# Patient Record
Sex: Female | Born: 1965 | Race: White | Hispanic: No | Marital: Married | Smoking: Never smoker
Health system: Southern US, Community
[De-identification: ages and names within clinical notes are randomized; demographics above are authoritative.]

## PROBLEM LIST (undated history)

## (undated) DIAGNOSIS — D352 Benign neoplasm of pituitary gland: Secondary | ICD-10-CM

## (undated) DIAGNOSIS — M1990 Unspecified osteoarthritis, unspecified site: Secondary | ICD-10-CM

## (undated) DIAGNOSIS — M6740 Ganglion, unspecified site: Secondary | ICD-10-CM

## (undated) DIAGNOSIS — H539 Unspecified visual disturbance: Secondary | ICD-10-CM

## (undated) HISTORY — PX: COLONOSCOPY: SHX174

## (undated) HISTORY — DX: Unspecified visual disturbance: H53.9

## (undated) HISTORY — PX: GANGLION CYST EXCISION: SHX1691

---

## 2006-11-15 ENCOUNTER — Ambulatory Visit (HOSPITAL_COMMUNITY): Admission: RE | Admit: 2006-11-15 | Discharge: 2006-11-15 | Payer: Self-pay | Admitting: Obstetrics and Gynecology

## 2006-11-15 ENCOUNTER — Encounter (INDEPENDENT_AMBULATORY_CARE_PROVIDER_SITE_OTHER): Payer: Self-pay | Admitting: *Deleted

## 2009-03-19 ENCOUNTER — Ambulatory Visit: Payer: Self-pay | Admitting: Sports Medicine

## 2009-03-19 DIAGNOSIS — M76829 Posterior tibial tendinitis, unspecified leg: Secondary | ICD-10-CM | POA: Insufficient documentation

## 2009-07-05 ENCOUNTER — Ambulatory Visit: Payer: Self-pay | Admitting: Sports Medicine

## 2009-07-05 DIAGNOSIS — M722 Plantar fascial fibromatosis: Secondary | ICD-10-CM | POA: Insufficient documentation

## 2009-08-23 ENCOUNTER — Ambulatory Visit: Payer: Self-pay

## 2011-03-03 NOTE — H&P (Signed)
NAME:  Barbara Estrada, HICKMON NO.:  [id number]   MEDICAL RECORD NO.:  [id number]          PATIENT TYPE:  AMB   LOCATION:  SDC                           FACILITY:  [facility]   PHYSICIAN:  Juluis Mire, M.D.   DATE OF BIRTH:  [birth year]   DATE OF ADMISSION:  DATE OF DISCHARGE:                              HISTORY & PHYSICAL   The patient is a 45 year old nulligravida female with a history of heavy  bleeding.  Her cycles are 5-7 days with heavy flow.  This has been  disruptive of activity.  Although her hemoglobin has remained stable, we  did a saline infusion ultrasound.  There were some small polyps,  otherwise unremarkable, findings highly suggestive of adenomyosis.  Her  husband has had a prior vasectomy.  In view of the findings, we are  going to proceed with hysteroscopic resection, and she has decided to  proceed with ablation.  We will proceed with ThermaChoice ablation after  resection of the polyps.   ALLERGIES:  No known drug allergies.   MEDICATIONS:  Dopamine.   PAST MEDICAL HISTORY:  Usual childhood disease without any significant  sequelae.  She has had no previous surgical or obstetrical history.   FAMILY HISTORY:  Mother, paternal grandmother with a history of breast  cancer.  Maternal grandfather with bladder cancer.  Father with history  of hypertension.   SOCIAL HISTORY:  No tobacco or alcohol use.   REVIEW OF SYSTEMS:  Noncontributory.   PHYSICAL EXAMINATION:  VITAL SIGNS:  Patient afebrile, stable vital  signs.  HEENT: The patient is normocephalic.  Pupils equal, round and reactive  to light and accommodation.  Extraocular movements were intact.  Sclerae  and conjunctivae clear.  Oropharynx clear.  NECK:  Without thyromegaly.  BREASTS:  No discrete masses.  LUNGS:  Clear.  CARDIAC:  Regular rhythm and rate without murmurs or gallops.  ABDOMEN:  Benign.  No mass, organomegaly or tenderness.  PELVIC:  Normal external genitalia.  Vaginal mucosa  is clear.  Cervix  unremarkable.  Uterus normal size, shape and contour.  Adnexa free of  masses or tenderness.  EXTREMITIES:  Trace edema.  NEUROLOGIC:  Grossly within normal limits.   IMPRESSION:  1. Menorrhagia.  2. Endometrial polyps.   PLAN:  At the present time the patient will undergo hysteroscopy.  We  will remove the polyps, then proceed with ThermaChoice ablation.  The  risks have been discussed including the risk of infection; the risk of  hemorrhage that could require transfusion with the risk of AIDS or  hepatitis; if bleeding would continue, the possible need for  hysterectomy; the risk of injury to adjacent organs including but not  limited to the bowel that could require further exploratory surgery; the  risk of deep venous thrombosis and pulmonary embolus.  She does  understand this precludes future pregnancies.  Again, her husband has  had a vasectomy.  Success rates of 80-90% are quoted, amenorrhea rates  of 40% discussed.      Juluis Mire, M.D.  Electronically Signed     JSM/MEDQ  D:  11/15/2006  T:  11/15/2006  Job:  [id number]

## 2011-03-03 NOTE — Op Note (Signed)
NAME:  Barbara Estrada, Barbara Estrada                 ACCOUNT NO.:  [id number]   MEDICAL RECORD NO.:  [id number]          PATIENT TYPE:  AMB   LOCATION:  SDC                           FACILITY:  [facility]   PHYSICIAN:  Juluis Mire, M.D.   DATE OF BIRTH:  08/29/1966   DATE OF PROCEDURE:  11/15/2006  DATE OF DISCHARGE:                               OPERATIVE REPORT   PREOPERATIVE DIAGNOSIS:  Menorrhagia.   POSTOPERATIVE DIAGNOSIS:  Menorrhagia.   PROCEDURE:  1. Hysteroscopy.  2. Endometrial curettings.  3. ThermaChoice ablation.  4. Paracervical block.   SURGEON:  Juluis Mire, M.D.   ANESTHESIA:  Paracervical block and general anesthesia.   ESTIMATED BLOOD LOSS:  Minimal.   PACKS AND DRAINS:  None.   INTRAOPERATIVE BLOOD REPLACEMENT:  None.   COMPLICATIONS:  None.   INDICATIONS:  In dictated history and physical.   PROCEDURE:  The patient was taken to the OR and placed in supine  position.  After satisfactory level of general anesthesia was obtained,  the patient was placed in dorsal lithotomy position using the Allen  stirrups.  Perineum and vagina were cleansed out with Betadine and  draped as a sterile field.  Speculum was placed in the vaginal vault and  cervix was grasped with a single-tooth tenaculum.  Paracervical block  was instituted using 1% Nesacaine.  Uterus sounded to 8 cm.  Cervix was  dilated to a size 21 Pratt dilator.  Hysteroscope was introduced and  intrauterine cavity was distended using D-5-W.  Visualization revealed  no endometrial polyps at the present time, just some thickened  endometrium.  We did endometrial curettings and this was sent for  pathology.  At this point in time, ThermaChoice was brought in.  It was  inserted.  The balloon was inflated to an adequate pressure.  After it  stabilized, the ablation cycle was done for 8 minutes.  After the cool-  down cycle, the balloon was deflated and removed intact.  We again did a  hysteroscopy on her.  She had  adequate ablation of the endometrium.  There were no signs of perforation or  complications.  At this point in time, the single-tooth tenaculum and  speculum were then removed.  The patient was taken out of the dorsal  lithotomy position and once alert and extubated, transferred to recovery  room in good condition.  Sponge, instrument and needle count was  reported as correct by circulating nurse x2.      Juluis Mire, M.D.  Electronically Signed     JSM/MEDQ  D:  11/15/2006  T:  11/15/2006  Job:  [id number]

## 2011-11-01 ENCOUNTER — Other Ambulatory Visit: Payer: Self-pay | Admitting: Internal Medicine

## 2011-11-01 DIAGNOSIS — Z1231 Encounter for screening mammogram for malignant neoplasm of breast: Secondary | ICD-10-CM

## 2011-11-22 ENCOUNTER — Ambulatory Visit
Admission: RE | Admit: 2011-11-22 | Discharge: 2011-11-22 | Disposition: A | Discharge status: Home / Self Care | Payer: 59 | Source: Ambulatory Visit | Attending: Internal Medicine | Admitting: Internal Medicine

## 2011-11-22 DIAGNOSIS — Z1231 Encounter for screening mammogram for malignant neoplasm of breast: Secondary | ICD-10-CM

## 2011-11-23 ENCOUNTER — Ambulatory Visit (INDEPENDENT_AMBULATORY_CARE_PROVIDER_SITE_OTHER): Payer: 59 | Admitting: Family Medicine

## 2011-11-23 VITALS — BP 128/76 | Ht 66.0 in | Wt 145.0 lb

## 2011-11-23 DIAGNOSIS — M7662 Achilles tendinitis, left leg: Secondary | ICD-10-CM

## 2011-11-23 DIAGNOSIS — M7660 Achilles tendinitis, unspecified leg: Secondary | ICD-10-CM

## 2011-11-23 DIAGNOSIS — M7630 Iliotibial band syndrome, unspecified leg: Secondary | ICD-10-CM

## 2011-11-23 DIAGNOSIS — M7661 Achilles tendinitis, right leg: Secondary | ICD-10-CM

## 2011-11-23 DIAGNOSIS — M629 Disorder of muscle, unspecified: Secondary | ICD-10-CM

## 2011-11-23 MED ORDER — NITROGLYCERIN 0.2 MG/HR TD PT24: MEDICATED_PATCH | TRANSDERMAL | Status: DC

## 2011-11-23 NOTE — Progress Notes (Signed)
Subjective:    Patient ID: Barbara Estrada, female    DOB: 1966-01-28, 46 y.o.   MRN: [id number]  HPI  Ms Quinby is a pleasant 46 years old and the patient went today complaining of bilateral Achilles tendinitis  for the last 8 months. She is a runner. She is running 22 miles a week. He is training for half pack April. She denies an injury to her heels. Today the pins were in the right side. The pain is located in the posterior heel at the insertion of the Achilles tendon the calcaneus, the right-sided pain is 410 intensity, and left-sided tumor in intensity. The pain is worse after running. Not as much during running. Mild swelling. No numbness or tingling. She has tried some stretches without no much success.  His also complaining of right knee lateral pain for the last month, she denies an injury to her knee. The pain is located on the lateral aspect of the knee, dull ache, 2/10 intensity, worse with running he improved by rest, the pain radiates to the lateral aspect of her right thigh.. No mechanical symptoms of her knee.  Review of Systems  Constitutional: Negative for fever, chills, diaphoresis and fatigue.  Musculoskeletal: Negative for back pain, joint swelling, arthralgias and gait problem.  Neurological: Negative for weakness and numbness.       Objective:   Physical Exam  Constitutional: She is oriented to person, place, and time. She appears well-developed and well-nourished.       BP 128/76  Ht 5\' 6"  (1.676 m)  Wt 145 lb (65.772 kg)  BMI 23.40 kg/m2   Pulmonary/Chest: Effort normal.  Musculoskeletal:       Right heel with tenderness to palpation in the insertion of the Achilles tendon the calcaneus. 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 The Achilles tendon is intact. Neurovascularly intact.  Left heel with tenderness to palpation in the insertion of the Achilles tendon the calcaneus. 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 The Achilles tendon is intact. Neurovascularly  intact. Feet with a good structure  Right knee with intact skin tear range of motion without pain. There is tenderness to palpation along the distal IT band. Ligaments are intact the. A pain on either joint line. Mild patellofemoral crepitus with patellofemoral compression test positive    Neurological: She is alert and oriented to person, place, and time.  Skin: Skin is warm. No rash noted. No erythema.  Psychiatric: She has a normal mood and affect. Her behavior is normal. Thought content normal.          Assessment & Plan:   1. Achilles tendinitis of right lower extremity  nitroGLYCERIN (NITRODUR - DOSED IN MG/24 HR) 0.2 mg/hr  2. Achilles tendinitis of left lower extremity  nitroGLYCERIN (NITRODUR - DOSED IN MG/24 HR) 0.2 mg/hr  3. IT band syndrome     Use knee sleeve Decrease running distance by a mile for the next 2 weeks Use knee sleeve in the right knee IT band stretches daily Achilles excentric exercises Ice in achilles tendons and it band 20 min twice  A day  Nitroglycerin Protocol   Apply 1/4 nitroglycerin patch to affected area daily.  Change position of patch within the affected area every 24 hours.  You may experience a headache during the first 1-2 weeks of using the patch, these should subside.  If you experience headaches after beginning nitroglycerin patch treatment, you may take your preferred over the counter pain reliever.  Another side effect of the  nitroglycerin patch is skin irritation or rash related to patch adhesive.  Please notify our office if you develop more severe headaches or rash, and stop the patch.  Tendon healing with nitroglycerin patch may require 12 to 24 weeks depending on the extent of injury.  Men should not use if taking Viagra, Cialis, or Levitra.   Do not use if you have migraines or rosacea.

## 2011-11-23 NOTE — Patient Instructions (Signed)
Use knee sleeve Decrease running distance by a mile for the next 2 weeks Use knee sleeve in the right knee IT band stretches daily Achilles excentric exercises Ice in achilles tendons and it band 20 min twice  A day  Nitroglycerin Protocol   Apply 1/4 nitroglycerin patch to affected area daily.  Change position of patch within the affected area every 24 hours.  You may experience a headache during the first 1-2 weeks of using the patch, these should subside.  If you experience headaches after beginning nitroglycerin patch treatment, you may take your preferred over the counter pain reliever.  Another side effect of the nitroglycerin patch is skin irritation or rash related to patch adhesive.  Please notify our office if you develop more severe headaches or rash, and stop the patch.  Tendon healing with nitroglycerin patch may require 12 to 24 weeks depending on the extent of injury.  Men should not use if taking Viagra, Cialis, or Levitra.   Do not use if you have migraines or rosacea.

## 2011-12-21 ENCOUNTER — Ambulatory Visit (INDEPENDENT_AMBULATORY_CARE_PROVIDER_SITE_OTHER): Payer: 59 | Admitting: Family Medicine

## 2011-12-21 ENCOUNTER — Encounter: Payer: Self-pay | Admitting: Family Medicine

## 2011-12-21 VITALS — BP 112/77 | HR 63

## 2011-12-21 DIAGNOSIS — M629 Disorder of muscle, unspecified: Secondary | ICD-10-CM

## 2011-12-21 DIAGNOSIS — M7661 Achilles tendinitis, right leg: Secondary | ICD-10-CM

## 2011-12-21 DIAGNOSIS — M7630 Iliotibial band syndrome, unspecified leg: Secondary | ICD-10-CM

## 2011-12-21 DIAGNOSIS — M7662 Achilles tendinitis, left leg: Secondary | ICD-10-CM | POA: Insufficient documentation

## 2011-12-21 DIAGNOSIS — M7660 Achilles tendinitis, unspecified leg: Secondary | ICD-10-CM

## 2011-12-21 NOTE — Progress Notes (Signed)
  Subjective:    Patient ID: Barbara Estrada, female    DOB: 1966-04-28, 46 y.o.   MRN: [id number]  HPI  Barbara Estrada is a pleasant 46 years old patient, today to followup on her bilateral Achilles tendinitis in right knee IT band syndrome. We saw her previously for weeks ago and started her on nitroglycerin patch protocol for both of her Achilles, outflow eccentric calf and Achilles stretches. Regarding her IT band we started her on IT band, she has protocol in place her on a compression knee sleeve to help her running.   She states that she has improved significantly, her left Achilles has improved 90%, right Achilles has improved 80%, left IT band has improved 95% . She denies any severe side effect with the nitroglycerin patches.  She is running about 10 miles a week present. Her goal is to participate in the half marathon in the next coming April.  Patient Active Problem List  Diagnoses  . TIBIALIS TENDINITIS  . PLANTAR FASCIITIS, RIGHT  . Achilles tendinitis on left  . IT band syndrome   Current Outpatient Prescriptions on File Prior to Visit  Medication Sig Dispense Refill  . nitroGLYCERIN (NITRODUR - DOSED IN MG/24 HR) 0.2 mg/hr Nitroglycerin Protocol  Apply 1/4 nitroglycerin patch to affected area daily. Change position of patch within the affected area every 24 hours.  30 patch  2   No Known Allergies      Review of Systems  Constitutional: Negative for fever, chills, diaphoresis and fatigue.  Cardiovascular: Negative for leg swelling.  Musculoskeletal: Negative for back pain, joint swelling, arthralgias and gait problem.  Neurological: Negative for tremors, seizures, weakness and numbness.       Objective:   Physical Exam  Constitutional: She is oriented to person, place, and time. She appears well-developed and well-nourished.       BP 112/77  Pulse 63   Pulmonary/Chest: Effort normal.  Musculoskeletal:       Right heel with mild tenderness to palpation in the  insertion of the Achilles tendon the calcaneus. The achilles nodule has decreased. The Achilles tendon is intact. Neurovascularly intact.  Left heel with tenderness to palpation in the insertion of the Achilles tendon the calcaneus. The achilles nodule has decreased. The Achilles tendon is intact. Neurovascularly intact. Feet with a good structure  Right knee with intact skin tear range of motion without pain.  No tenderness to palpation along the distal IT band. Ligaments are intact the. No pain on either joint line. Mild patellofemoral crepitus with patellofemoral compression test positive     Neurological: She is alert and oriented to person, place, and time.  Skin: Skin is warm. No erythema.  Psychiatric: She has a normal mood and affect. Her behavior is normal. Thought content normal.          Assessment & Plan:   1. Achilles tendinitis on right   2. Achilles tendinitis on left   3. IT band syndrome    Cont nitro patches. Cont achilles excentric exercises F/U in 4 weeks . We will repeat U/S of achilles in that visit Increase running distance gradually Cross train in between running days.

## 2012-01-18 ENCOUNTER — Ambulatory Visit (INDEPENDENT_AMBULATORY_CARE_PROVIDER_SITE_OTHER): Payer: 59 | Admitting: Family Medicine

## 2012-01-18 ENCOUNTER — Encounter: Payer: Self-pay | Admitting: Family Medicine

## 2012-01-18 VITALS — BP 104/75 | HR 87

## 2012-01-18 DIAGNOSIS — M7662 Achilles tendinitis, left leg: Secondary | ICD-10-CM

## 2012-01-18 DIAGNOSIS — M7630 Iliotibial band syndrome, unspecified leg: Secondary | ICD-10-CM

## 2012-01-18 DIAGNOSIS — M7660 Achilles tendinitis, unspecified leg: Secondary | ICD-10-CM

## 2012-01-18 DIAGNOSIS — M7661 Achilles tendinitis, right leg: Secondary | ICD-10-CM | POA: Insufficient documentation

## 2012-01-18 DIAGNOSIS — M629 Disorder of muscle, unspecified: Secondary | ICD-10-CM

## 2012-01-18 NOTE — Patient Instructions (Signed)
Increase nitro patch dose to a 1/2 of patch every 24 hours on the right achilles tendon Continue achilles strengthening exercises Continue heel lift while running Continue hips strengthening Do not run daily. F/U 2 month.

## 2012-01-18 NOTE — Progress Notes (Signed)
Subjective:    Patient ID: Barbara Estrada, female    DOB: 1965-12-01, 46 y.o.   MRN: [id number]  HPI  Ms.Marlar is a less than 45 years coming today to followup on her bilateral achilles tendinopathy, and also her right IT band syndrome. She has been using nitroglycerin patch in both of her Achilles tendons in February 2013, she has been doing the Achilles tendon eccentric exercises, she has been Ethelene Browns exercises for her hip abductors muscles, she has been doing present the IT band, she has been using a knee sleeve and right knee. Earlene Plater is doing much better. She denies any pain in her right IT band, she denies any pain in her left heel, she states that she is having a mild pain on her right Achilles tendon after running. He is tolerating well the nitroglycerin patch. She stopped the nitroglycerin patch on left heel 4 days ago. She is training for the [facility] marathon on 01/20/12. She is running average to 20 miles a week. She is doing well  Patient Active Problem List  Diagnoses  . TIBIALIS TENDINITIS  . PLANTAR FASCIITIS, RIGHT  . Achilles tendinitis on left  . IT band syndrome  . Achilles tendinitis on right   Current Outpatient Prescriptions on File Prior to Visit  Medication Sig Dispense Refill  . cabergoline (DOSTINEX) 0.5 MG tablet Take 0.5 mg by mouth 2 (two) times a week.      . nitroGLYCERIN (NITRODUR - DOSED IN MG/24 HR) 0.2 mg/hr Nitroglycerin Protocol  Apply 1/4 nitroglycerin patch to affected area daily. Change position of patch within the affected area every 24 hours.  30 patch  2   No Known Allergies   Review of Systems  Constitutional: Negative for fever, chills, diaphoresis and fatigue.  Musculoskeletal: Negative for back pain, joint swelling, arthralgias and gait problem.  Neurological: Positive for numbness. Negative for weakness.       Objective:   Physical Exam  Constitutional: She is oriented to person, place, and time. She appears well-developed and  well-nourished.       BP 104/75  Pulse 87   Pulmonary/Chest: Effort normal.  Musculoskeletal:       Right heel with no tenderness to palpation in the insertion of the Achilles tendon the calcaneus. The achilles nodule has decreased. The Achilles tendon is intact. Neurovascularly intact.  Left heel with no tenderness to palpation in the insertion of the Achilles tendon the calcaneus. The achilles nodule has decreased. The Achilles tendon is intact. Neurovascularly intact. Feet with a good structure  Right knee with intact skin tear range of motion without pain.  No tenderness to palpation along the distal IT band. Ligaments are intact the. No pain on either joint line.  Hip abductors strength 5/5   Neurological: She is alert and oriented to person, place, and time.  Skin: Skin is warm. No rash noted. No erythema. No pallor.    MSK U/S :  R achilles tendon with calcification in the insertion of the achilles in the calcaneous.No tears. L achilles tendon with calcification in the insertion of the achilles in the calcaneous.No tears.        Assessment & Plan:   1. Achilles tendinitis on left   2. IT band syndrome   3. Achilles tendinitis on right    Increase nitro patch dose to a 1/2 of patch every 24 hours on the right achilles tendon Continue achilles strengthening exercises Continue heel lift while running Continue hips strengthening Do not run  daily. F/U 2 month.

## 2012-11-01 ENCOUNTER — Other Ambulatory Visit: Payer: Self-pay | Admitting: Internal Medicine

## 2012-11-01 DIAGNOSIS — Z1231 Encounter for screening mammogram for malignant neoplasm of breast: Secondary | ICD-10-CM

## 2012-11-29 ENCOUNTER — Ambulatory Visit: Payer: 59

## 2012-12-13 ENCOUNTER — Ambulatory Visit
Admission: RE | Admit: 2012-12-13 | Discharge: 2012-12-13 | Disposition: A | Discharge status: Home / Self Care | Payer: 59 | Source: Ambulatory Visit | Attending: Internal Medicine | Admitting: Internal Medicine

## 2012-12-13 DIAGNOSIS — Z1231 Encounter for screening mammogram for malignant neoplasm of breast: Secondary | ICD-10-CM

## 2013-05-20 ENCOUNTER — Ambulatory Visit: Payer: 59 | Admitting: Family Medicine

## 2013-05-20 ENCOUNTER — Ambulatory Visit (INDEPENDENT_AMBULATORY_CARE_PROVIDER_SITE_OTHER): Payer: 59 | Admitting: Family Medicine

## 2013-05-20 ENCOUNTER — Encounter: Payer: Self-pay | Admitting: Family Medicine

## 2013-05-20 VITALS — BP 129/86 | HR 65 | Ht 67.0 in | Wt 150.0 lb

## 2013-05-20 DIAGNOSIS — M25569 Pain in unspecified knee: Secondary | ICD-10-CM

## 2013-05-20 DIAGNOSIS — M79609 Pain in unspecified limb: Secondary | ICD-10-CM

## 2013-05-20 DIAGNOSIS — M79605 Pain in left leg: Secondary | ICD-10-CM

## 2013-05-20 DIAGNOSIS — M25562 Pain in left knee: Secondary | ICD-10-CM

## 2013-05-20 NOTE — Patient Instructions (Addendum)
Start physical therapy and transition to a home exercise program - do this every day. Heat as needed 15 minutes at a time for spasms. Tylenol and/or aleve as needed for pain. Calf sleeve for compression when exercising (and any time it's painful, spasming on you). I'd avoid running for 2 weeks then restart a walk:jog 1:1 for program and increase total run time by 5 minutes, jog time by 1 minute each day (so 2:1 WUJ:WJXB for 15 minutes, 3:1 for 20 minutes etc). Cross train in meantime. Continue using your heel lifts. Follow up with me in 5-6 weeks for reevaluation.

## 2013-05-22 ENCOUNTER — Encounter: Payer: Self-pay | Admitting: Family Medicine

## 2013-05-22 DIAGNOSIS — M79605 Pain in left leg: Secondary | ICD-10-CM | POA: Insufficient documentation

## 2013-05-22 NOTE — Progress Notes (Signed)
Patient ID: Barbara Estrada, female   DOB: 25-Sep-1966, 47 y.o.   MRN: [id number]  PCP: Otila Back  Subjective:   HPI: Patient is a 47 y.o. female here for left calf pain.  Patient reports a week ago Friday she had some cramping in lateral left calf during a road run. Stretched this, took a few days off and felt better. Then on Saturday ran on a treadmill and felt bad cramping in same area - anterolateral lower leg. Stretched, walked, took a bodypump class. Has not run since this. Was up to 10-15 miles per week. Tried icing, biofreeze. Going to do a 1/2 marathon in November.  No pop, swelling, bruising.  History reviewed. No pertinent past medical history.  Current Outpatient Prescriptions on File Prior to Visit  Medication Sig Dispense Refill  . cabergoline (DOSTINEX) 0.5 MG tablet Take 0.5 mg by mouth 2 (two) times a week.      . nitroGLYCERIN (NITRODUR - DOSED IN MG/24 HR) 0.2 mg/hr Nitroglycerin Protocol  Apply 1/4 nitroglycerin patch to affected area daily. Change position of patch within the affected area every 24 hours.  30 patch  2   No current facility-administered medications on file prior to visit.    History reviewed. No pertinent past surgical history.  No Known Allergies  History   Social History  . Marital Status: Married    Spouse Name: N/A    Number of Children: N/A  . Years of Education: N/A   Occupational History  . Not on file.   Social History Main Topics  . Smoking status: Never Smoker   . Smokeless tobacco: Never Used  . Alcohol Use: Not on file  . Drug Use: Not on file  . Sexually Active: Not on file   Other Topics Concern  . Not on file   Social History Narrative  . No narrative on file    Family History  Problem Relation Age of Onset  . Hypertension Father   . Hyperlipidemia Neg Hx   . Diabetes Neg Hx   . Heart attack Neg Hx   . Sudden death Neg Hx     BP 129/86  Pulse 65  Ht 5\' 7"  (1.702 m)  Wt 150 lb (68.04 kg)   BMI 23.49 kg/m2  Review of Systems: See HPI above.    Objective:  Physical Exam:  Gen: NAD  L lower leg/ankle: No gross deformity, swelling, ecchymoses FROM ankle without pain. Strength 5/5 all directions with ankle, knee. TTP mildly lateral to tibia midportion of anterior musculature. Negative ant drawer and talar tilt.   Negative syndesmotic compression. Thompsons test negative. Negative hop test, fulcrum. NV intact distally.  MSK u/s: no abnormalities noted of fibula.    Assessment & Plan:  1. Left leg pain - consistent with lower leg extensor strain/spasms.  Very benign exam today.  No evidence stress fracture.  Start with PT, HEP, calf sleeve for compression.  Tylenol/aleve as needed.  Rest for 2 weeks but cross train.  Then restart walk:jog program.

## 2013-05-22 NOTE — Assessment & Plan Note (Signed)
consistent with lower leg extensor strain/spasms.  Very benign exam today.  No evidence stress fracture.  Start with PT, HEP, calf sleeve for compression.  Tylenol/aleve as needed.  Rest for 2 weeks but cross train.  Then restart walk:jog program.

## 2013-05-28 ENCOUNTER — Ambulatory Visit: Payer: 59 | Attending: Family Medicine

## 2013-05-28 DIAGNOSIS — IMO0001 Reserved for inherently not codable concepts without codable children: Secondary | ICD-10-CM | POA: Insufficient documentation

## 2013-05-28 DIAGNOSIS — M25569 Pain in unspecified knee: Secondary | ICD-10-CM | POA: Insufficient documentation

## 2013-06-05 ENCOUNTER — Ambulatory Visit: Payer: 59 | Admitting: Rehabilitation

## 2013-06-13 ENCOUNTER — Ambulatory Visit: Payer: 59

## 2013-06-27 ENCOUNTER — Encounter: Payer: Self-pay | Admitting: Family Medicine

## 2013-06-27 ENCOUNTER — Ambulatory Visit (INDEPENDENT_AMBULATORY_CARE_PROVIDER_SITE_OTHER): Payer: 59 | Admitting: Family Medicine

## 2013-06-27 VITALS — BP 113/75 | HR 59 | Ht 67.0 in | Wt 145.0 lb

## 2013-06-27 DIAGNOSIS — M79609 Pain in unspecified limb: Secondary | ICD-10-CM

## 2013-06-27 DIAGNOSIS — M79605 Pain in left leg: Secondary | ICD-10-CM

## 2013-06-27 NOTE — Progress Notes (Signed)
Patient ID: Barbara Estrada, female   DOB: November 27, 1965, 47 y.o.   MRN: [id number]  PCP: Otila Back  Subjective:   HPI: Patient is a 47 y.o. female here for left calf pain.  8/5: Patient reports a week ago Friday she had some cramping in lateral left calf during a road run. Stretched this, took a few days off and felt better. Then on Saturday ran on a treadmill and felt bad cramping in same area - anterolateral lower leg. Stretched, walked, took a bodypump class. Has not run since this. Was up to 10-15 miles per week. Tried icing, biofreeze. Going to do a 1/2 marathon in November.  No pop, swelling, bruising.  9/12: Patient reports no longer with pain. Used heel lifts, sleeve first week which helped. Did 3 visits of PT and now still doing HEP. No swelling, other complaints. Back on her running program with plans to do half marathon in November.  History reviewed. No pertinent past medical history.  Current Outpatient Prescriptions on File Prior to Visit  Medication Sig Dispense Refill  . cabergoline (DOSTINEX) 0.5 MG tablet Take 0.5 mg by mouth 2 (two) times a week.      . nitroGLYCERIN (NITRODUR - DOSED IN MG/24 HR) 0.2 mg/hr Nitroglycerin Protocol  Apply 1/4 nitroglycerin patch to affected area daily. Change position of patch within the affected area every 24 hours.  30 patch  2   No current facility-administered medications on file prior to visit.    History reviewed. No pertinent past surgical history.  No Known Allergies  History   Social History  . Marital Status: Married    Spouse Name: N/A    Number of Children: N/A  . Years of Education: N/A   Occupational History  . Not on file.   Social History Main Topics  . Smoking status: Never Smoker   . Smokeless tobacco: Never Used  . Alcohol Use: Not on file  . Drug Use: Not on file  . Sexual Activity: Not on file   Other Topics Concern  . Not on file   Social History Narrative  . No narrative on  file    Family History  Problem Relation Age of Onset  . Hypertension Father   . Hyperlipidemia Neg Hx   . Diabetes Neg Hx   . Heart attack Neg Hx   . Sudden death Neg Hx     BP 113/75  Pulse 59  Ht 5\' 7"  (1.702 m)  Wt 145 lb (65.772 kg)  BMI 22.71 kg/m2  Review of Systems: See HPI above.    Objective:  Physical Exam:  Gen: NAD  L lower leg/ankle: No gross deformity, swelling, ecchymoses FROM ankle without pain. Strength 5/5 all directions with ankle, knee. No TTP lateral to tibia midportion of anterior musculature. Negative ant drawer and talar tilt.   Negative syndesmotic compression. Thompsons test negative. Negative hop test, fulcrum. NV intact distally.  Assessment & Plan:  1. Left leg pain - consistent with lower leg extensor strain/spasms.  Resolved.  Continue HEP for another 4 weeks (3-4 days a week).  Follow up as needed.

## 2013-06-27 NOTE — Patient Instructions (Addendum)
Follow up as needed

## 2013-06-27 NOTE — Assessment & Plan Note (Signed)
consistent with lower leg extensor strain/spasms.  Resolved.  Continue HEP for another 4 weeks (3-4 days a week).  Follow up as needed.

## 2013-11-20 ENCOUNTER — Other Ambulatory Visit: Payer: Self-pay

## 2013-11-20 DIAGNOSIS — Z1231 Encounter for screening mammogram for malignant neoplasm of breast: Secondary | ICD-10-CM

## 2013-12-19 ENCOUNTER — Ambulatory Visit
Admission: RE | Admit: 2013-12-19 | Discharge: 2013-12-19 | Disposition: A | Discharge status: Home / Self Care | Payer: 59 | Source: Ambulatory Visit

## 2013-12-19 DIAGNOSIS — Z1231 Encounter for screening mammogram for malignant neoplasm of breast: Secondary | ICD-10-CM

## 2014-03-19 ENCOUNTER — Emergency Department (INDEPENDENT_AMBULATORY_CARE_PROVIDER_SITE_OTHER): Payer: 59

## 2014-03-19 ENCOUNTER — Encounter (HOSPITAL_COMMUNITY): Payer: Self-pay | Admitting: Emergency Medicine

## 2014-03-19 ENCOUNTER — Emergency Department (HOSPITAL_COMMUNITY)
Admission: EM | Admit: 2014-03-19 | Discharge: 2014-03-19 | Disposition: A | Discharge status: Home / Self Care | Payer: 59 | Source: Home / Self Care | Attending: Family Medicine | Admitting: Family Medicine

## 2014-03-19 DIAGNOSIS — J40 Bronchitis, not specified as acute or chronic: Secondary | ICD-10-CM

## 2014-03-19 MED ORDER — IPRATROPIUM BROMIDE 0.06 % NA SOLN: 2.0000 | Freq: Four times a day (QID) | NASAL | Status: DC

## 2014-03-19 MED ORDER — GUAIFENESIN-CODEINE 100-10 MG/5ML PO SOLN: 5.0000 mL | Freq: Every evening | ORAL | Status: DC | PRN

## 2014-03-19 MED ORDER — ALBUTEROL SULFATE HFA 108 (90 BASE) MCG/ACT IN AERS: 2.0000 | INHALATION_SPRAY | Freq: Four times a day (QID) | RESPIRATORY_TRACT | Status: DC | PRN

## 2014-03-19 MED ORDER — PREDNISONE 10 MG PO TABS: 30.0000 mg | ORAL_TABLET | Freq: Every day | ORAL | Status: DC

## 2014-03-19 MED ORDER — IPRATROPIUM-ALBUTEROL 0.5-2.5 (3) MG/3ML IN SOLN
RESPIRATORY_TRACT | Status: AC
  Filled 2014-03-19: qty 3

## 2014-03-19 MED ORDER — IPRATROPIUM-ALBUTEROL 0.5-2.5 (3) MG/3ML IN SOLN
3.0000 mL | Freq: Once | RESPIRATORY_TRACT | Status: AC
  Administered 2014-03-19: 3 mL via RESPIRATORY_TRACT

## 2014-03-19 NOTE — ED Provider Notes (Signed)
Barbara Estrada is a 48 y.o. female who presents to Urgent Care today for cough. Patient has had a cough now for 7-10 days. The cough is worse at night and in the morning. She also notes a sore throat and a runny nose. She has tried Mucinex and Tylenol which helped a bit. She denies any shortness of breath or wheezing. No fevers chills nausea vomiting or diarrhea. She feels well otherwise. Symptoms are moderate.   History reviewed. No pertinent past medical history. History  Substance Use Topics  . Smoking status: Never Smoker   . Smokeless tobacco: Never Used  . Alcohol Use: Yes   ROS as above Medications: No current facility-administered medications for this encounter.   Current Outpatient Prescriptions  Medication Sig Dispense Refill  . albuterol (PROVENTIL HFA;VENTOLIN HFA) 108 (90 BASE) MCG/ACT inhaler Inhale 2 puffs into the lungs every 6 (six) hours as needed for wheezing or shortness of breath.  1 Inhaler  2  . cabergoline (DOSTINEX) 0.5 MG tablet Take 0.5 mg by mouth 2 (two) times a week.      Marland Kitchen guaiFENesin-codeine 100-10 MG/5ML syrup Take 5 mLs by mouth at bedtime as needed for cough.  120 mL  0  . ipratropium (ATROVENT) 0.06 % nasal spray Place 2 sprays into both nostrils 4 (four) times daily.  15 mL  1  . nitroGLYCERIN (NITRODUR - DOSED IN MG/24 HR) 0.2 mg/hr Nitroglycerin Protocol  Apply 1/4 nitroglycerin patch to affected area daily. Change position of patch within the affected area every 24 hours.  30 patch  2  . predniSONE (DELTASONE) 10 MG tablet Take 3 tablets (30 mg total) by mouth daily.  15 tablet  0    Exam:  BP 134/64  Pulse 63  Temp(Src) 98.4 F (36.9 C) (Oral)  Resp 16  SpO2 96%  LMP 03/16/2014 Gen: Well NAD HEENT: EOMI,  MMM posterior pharynx with cobblestoning. Tympanic membranes are normal appearing bilaterally. Lungs: Normal work of breathing. Coarse breath sounds bilaterally Heart: RRR no MRG Abd: NABS, Soft. NT, ND Exts: Brisk capillary refill,  warm and well perfused.   Patient received a DuoNeb nebulizer treatment and had considerable improvement in symptoms and normalization of lung exam.  No results found for this or any previous visit (from the past 24 hour(s)). Dg Chest 2 View  03/19/2014   CLINICAL DATA:  Cough and fever and chest congestion.  EXAM: CHEST  2 VIEW  COMPARISON:  None.  FINDINGS: The lungs are well-expanded and clear. The heart and mediastinal structures are normal in appearance. There is no pleural effusion or pneumothorax. There is levoscoliosis of the thoracolumbar spine.  IMPRESSION: There is no active cardiopulmonary disease.   Electronically Signed   By: David  Martinique   On: 03/19/2014 09:45    Assessment and Plan: 48 y.o. female with bronchitis. Plan to treat with prednisone, albuterol, codeine containing cough medication, and Atrovent nasal spray.  Discussed warning signs or symptoms. Please see discharge instructions. Patient expresses understanding.    Gregor Hams, MD 03/19/14 1019

## 2014-03-19 NOTE — ED Notes (Signed)
C/o  Productive cough with green sputum.   Cough is worse at night.   Denies fever, n/v/d.  Symptom present x 10 days.   Mild relief with otc meds.

## 2014-03-19 NOTE — Discharge Instructions (Signed)
Thank you for coming in today. °Call or go to the emergency room if you get worse, have trouble breathing, have chest pains, or palpitations.  ° °Bronchitis °Bronchitis is inflammation of the airways that extend from the windpipe into the lungs (bronchi). The inflammation often causes mucus to develop, which leads to a cough. If the inflammation becomes severe, it may cause shortness of breath. °CAUSES  °Bronchitis may be caused by:  °· Viral infections.   °· Bacteria.   °· Cigarette smoke.   °· Allergens, pollutants, and other irritants.   °SIGNS AND SYMPTOMS  °The most common symptom of bronchitis is a frequent cough that produces mucus. Other symptoms include: °· Fever.   °· Body aches.   °· Chest congestion.   °· Chills.   °· Shortness of breath.   °· Sore throat.   °DIAGNOSIS  °Bronchitis is usually diagnosed through a medical history and physical exam. Tests, such as chest X-rays, are sometimes done to rule out other conditions.  °TREATMENT  °You may need to avoid contact with whatever caused the problem (smoking, for example). Medicines are sometimes needed. These may include: °· Antibiotics. These may be prescribed if the condition is caused by bacteria. °· Cough suppressants. These may be prescribed for relief of cough symptoms.   °· Inhaled medicines. These may be prescribed to help open your airways and make it easier for you to breathe.   °· Steroid medicines. These may be prescribed for those with recurrent (chronic) bronchitis. °HOME CARE INSTRUCTIONS °· Get plenty of rest.   °· Drink enough fluids to keep your urine clear or pale yellow (unless you have a medical condition that requires fluid restriction). Increasing fluids may help thin your secretions and will prevent dehydration.   °· Only take over-the-counter or prescription medicines as directed by your health care provider. °· Only take antibiotics as directed. Make sure you finish them even if you start to feel better. °· Avoid secondhand  smoke, irritating chemicals, and strong fumes. These will make bronchitis worse. If you are a smoker, quit smoking. Consider using nicotine gum or skin patches to help control withdrawal symptoms. Quitting smoking will help your lungs heal faster.   °· Put a cool-mist humidifier in your bedroom at night to moisten the air. This may help loosen mucus. Change the water in the humidifier daily. You can also run the hot water in your shower and sit in the bathroom with the door closed for 5 10 minutes.   °· Follow up with your health care provider as directed.   °· Wash your hands frequently to avoid catching bronchitis again or spreading an infection to others.   °SEEK MEDICAL CARE IF: °Your symptoms do not improve after 1 week of treatment.  °SEEK IMMEDIATE MEDICAL CARE IF: °· Your fever increases. °· You have chills.   °· You have chest pain.   °· You have worsening shortness of breath.   °· You have bloody sputum. °· You faint.   °· You have lightheadedness. °· You have a severe headache.   °· You vomit repeatedly. °MAKE SURE YOU:  °· Understand these instructions. °· Will watch your condition. °· Will get help right away if you are not doing well or get worse. °Document Released: 10/02/2005 Document Revised: 07/23/2013 Document Reviewed: 05/27/2013 °[employer]® Patient Information ©2014 [employer]. ° °

## 2014-03-23 ENCOUNTER — Emergency Department (HOSPITAL_COMMUNITY)
Admission: EM | Admit: 2014-03-23 | Discharge: 2014-03-23 | Disposition: A | Discharge status: Home / Self Care | Payer: 59 | Source: Home / Self Care | Attending: Family Medicine | Admitting: Family Medicine

## 2014-03-23 ENCOUNTER — Encounter (HOSPITAL_COMMUNITY): Payer: Self-pay | Admitting: Emergency Medicine

## 2014-03-23 DIAGNOSIS — L988 Other specified disorders of the skin and subcutaneous tissue: Secondary | ICD-10-CM

## 2014-03-23 DIAGNOSIS — R238 Other skin changes: Secondary | ICD-10-CM

## 2014-03-23 HISTORY — DX: Ganglion, unspecified site: M67.40

## 2014-03-23 MED ORDER — TRIAMCINOLONE ACETONIDE 0.1 % EX CREA: 1.0000 "application " | TOPICAL_CREAM | Freq: Two times a day (BID) | CUTANEOUS | Status: DC

## 2014-03-23 NOTE — ED Provider Notes (Signed)
CSN: [id number]     Arrival date & time 03/23/14  0806 History   First MD Initiated Contact with Patient 03/23/14 0818     Chief Complaint  Patient presents with  . Rash   (Consider location/radiation/quality/duration/timing/severity/associated sxs/prior Treatment) HPI Comments: 3-4 d ago this 48 y o f noticed small red area to palm of right hand. Has since developed shallow vesicles with a couple of smaller satellite papulovesicular lesions.    Past Medical History  Diagnosis Date  . Ganglion cyst    Past Surgical History  Procedure Laterality Date  . Ganglion cyst excision Bilateral    Family History  Problem Relation Age of Onset  . Hypertension Father   . Hyperlipidemia Neg Hx   . Diabetes Neg Hx   . Heart attack Neg Hx   . Sudden death Neg Hx    History  Substance Use Topics  . Smoking status: Never Smoker   . Smokeless tobacco: Never Used  . Alcohol Use: Yes   OB History   Grav Para Term Preterm Abortions TAB SAB Ect Mult Living                 Review of Systems  Constitutional: Negative.  Negative for fever, chills, diaphoresis, activity change and fatigue.  HENT: Negative.   Respiratory: Negative.   All other systems reviewed and are negative.   Allergies  Review of patient's allergies indicates no known allergies.  Home Medications   Prior to Admission medications   Medication Sig Start Date End Date Taking? Authorizing Provider  albuterol (PROVENTIL HFA;VENTOLIN HFA) 108 (90 BASE) MCG/ACT inhaler Inhale 2 puffs into the lungs every 6 (six) hours as needed for wheezing or shortness of breath. 03/19/14   Gregor Hams, MD  cabergoline (DOSTINEX) 0.5 MG tablet Take 0.5 mg by mouth 2 (two) times a week. 11/12/11   Historical Provider, MD  guaiFENesin-codeine 100-10 MG/5ML syrup Take 5 mLs by mouth at bedtime as needed for cough. 03/19/14   Gregor Hams, MD  ipratropium (ATROVENT) 0.06 % nasal spray Place 2 sprays into both nostrils 4 (four) times daily. 03/19/14    Gregor Hams, MD  nitroGLYCERIN (NITRODUR - DOSED IN MG/24 HR) 0.2 mg/hr Nitroglycerin Protocol  Apply 1/4 nitroglycerin patch to affected area daily. Change position of patch within the affected area every 24 hours. 11/23/11 11/22/12  Sherren Kerns, MD  predniSONE (DELTASONE) 10 MG tablet Take 3 tablets (30 mg total) by mouth daily. 03/19/14   Gregor Hams, MD  triamcinolone cream (KENALOG) 0.1 % Apply 1 application topically 2 (two) times daily. 03/23/14   Janne Napoleon, NP   BP 126/84  Pulse 71  Temp(Src) 98.3 F (36.8 C) (Oral)  Resp 16  SpO2 98%  LMP 03/16/2014 Physical Exam  Nursing note and vitals reviewed. Constitutional: She is oriented to person, place, and time. She appears well-developed and well-nourished. No distress.  Eyes: Conjunctivae and EOM are normal.  Cardiovascular: Normal rate.   Pulmonary/Chest: Effort normal. No respiratory distress.  Musculoskeletal: She exhibits no edema and no tenderness.  Neurological: She is alert and oriented to person, place, and time.  Skin: Skin is warm and dry.  Lesion to right palm as described in HPI. No signs of infection. Edges well marginated. No draining, bleeding.  Psychiatric: She has a normal mood and affect.    ED Course  Procedures (including critical care time) Labs Review Labs Reviewed - No data to display  Imaging Review No results  found.   MDM   1. Vesicles     Uncertain of etiology. Possible localized reaction to insect bite or virus of the skin. Try triamcinolone for now. If not improving or getting worse f/u with PCP      Janne Napoleon, NP 03/23/14 [phone]

## 2014-03-23 NOTE — Discharge Instructions (Signed)
Blisters possible cause insect bite, virus of skin Blisters are fluid-filled sacs that form within the skin. Common causes of blistering are friction, burns, and exposure to irritating chemicals. The fluid in the blister protects the underlying damaged skin. Most of the time it is not recommended that you open blisters. When a blister is opened, there is an increased chance for infection. Usually, a blister will open on its own. They then dry up and peel off within 10 days. If the blister is tense and uncomfortable (painful) the fluid may be drained. If it is drained the roof of the blister should be left intact. The draining should only be done by a medical professional under aseptic conditions. Poorly fitting shoes and boots can cause blisters by being too tight or too loose. Wearing extra socks or using tape, bandages, or pads over the blister-prone area helps prevent the problem by reducing friction. Blisters heal more slowly if you have diabetes or if you have problems with your circulation. You need to be careful about medical follow-up to prevent infection. HOME CARE INSTRUCTIONS  Protect areas where blisters have formed until the skin is healed. Use a special bandage with a hole cut in the middle around the blister. This reduces pressure and friction. When the blister breaks, trim off the loose skin and keep the area clean by washing it with soap daily. Soaking the blister or broken-open blister with diluted vinegar twice daily for 15 minutes will dry it up and speed the healing. Use 3 tablespoons of white vinegar per quart of water (45 mL white vinegar per liter of water). An antibiotic ointment and a bandage can be used to cover the area after soaking.  SEEK MEDICAL CARE IF:   You develop increased redness, pain, swelling, or drainage in the blistered area.  You develop a pus-like discharge from the blistered area, chills, or a fever. MAKE SURE YOU:   Understand these instructions.  Will  watch your condition.  Will get help right away if you are not doing well or get worse. Document Released: 11/09/2004 Document Revised: 12/25/2011 Document Reviewed: 10/07/2008 [facility] Patient Information 2014 [place], [place].  Rash A rash is a change in the color or texture of your skin. There are many different types of rashes. You may have other problems that accompany your rash. CAUSES   Infections.  Allergic reactions. This can include allergies to pets or foods.  Certain medicines.  Exposure to certain chemicals, soaps, or cosmetics.  Heat.  Exposure to poisonous plants.  Tumors, both cancerous and noncancerous. SYMPTOMS   Redness.  Scaly skin.  Itchy skin.  Dry or cracked skin.  Bumps.  Blisters.  Pain. DIAGNOSIS  Your caregiver may do a physical exam to determine what type of rash you have. A skin sample (biopsy) may be taken and examined under a microscope. TREATMENT  Treatment depends on the type of rash you have. Your caregiver may prescribe certain medicines. For serious conditions, you may need to see a skin doctor (dermatologist). HOME CARE INSTRUCTIONS   Avoid the substance that caused your rash.  Do not scratch your rash. This can cause infection.  You may take cool baths to help stop itching.  Only take over-the-counter or prescription medicines as directed by your caregiver.  Keep all follow-up appointments as directed by your caregiver. SEEK IMMEDIATE MEDICAL CARE IF:  You have increasing pain, swelling, or redness.  You have a fever.  You have new or severe symptoms.  You have body  aches, diarrhea, or vomiting.  Your rash is not better after 3 days. MAKE SURE YOU:  Understand these instructions.  Will watch your condition.  Will get help right away if you are not doing well or get worse. Document Released: 09/22/2002 Document Revised: 12/25/2011 Document Reviewed: 07/17/2011 [facility] Patient Information 2014 [place],  [place].

## 2014-03-23 NOTE — ED Notes (Signed)
Rash/blisters to right palm.  Patient admits to picking at blisters yesterday

## 2014-03-24 NOTE — ED Provider Notes (Signed)
Medical screening examination/treatment/procedure(s) were performed by a resident physician or non-physician practitioner and as the supervising physician I was immediately available for consultation/collaboration.  Lynne Leader, MD    Gregor Hams, MD 03/24/14 [phone]

## 2014-06-24 ENCOUNTER — Ambulatory Visit: Payer: 59 | Admitting: Family Medicine

## 2014-07-13 ENCOUNTER — Other Ambulatory Visit (HOSPITAL_COMMUNITY)
Admission: RE | Admit: 2014-07-13 | Discharge: 2014-07-13 | Disposition: A | Discharge status: Home / Self Care | Payer: 59 | Source: Ambulatory Visit | Attending: Family Medicine | Admitting: Family Medicine

## 2014-07-13 ENCOUNTER — Encounter: Payer: Self-pay | Admitting: Obstetrics and Gynecology

## 2014-07-13 ENCOUNTER — Ambulatory Visit (INDEPENDENT_AMBULATORY_CARE_PROVIDER_SITE_OTHER): Payer: 59 | Admitting: Obstetrics and Gynecology

## 2014-07-13 VITALS — BP 122/79 | HR 58 | Temp 98.1°F | Wt 169.0 lb

## 2014-07-13 DIAGNOSIS — H9190 Unspecified hearing loss, unspecified ear: Secondary | ICD-10-CM

## 2014-07-13 DIAGNOSIS — Z124 Encounter for screening for malignant neoplasm of cervix: Secondary | ICD-10-CM | POA: Diagnosis present

## 2014-07-13 DIAGNOSIS — E663 Overweight: Secondary | ICD-10-CM

## 2014-07-13 DIAGNOSIS — D352 Benign neoplasm of pituitary gland: Secondary | ICD-10-CM

## 2014-07-13 DIAGNOSIS — Z23 Encounter for immunization: Secondary | ICD-10-CM

## 2014-07-13 DIAGNOSIS — Z01419 Encounter for gynecological examination (general) (routine) without abnormal findings: Secondary | ICD-10-CM

## 2014-07-13 DIAGNOSIS — Z1151 Encounter for screening for human papillomavirus (HPV): Secondary | ICD-10-CM | POA: Insufficient documentation

## 2014-07-13 DIAGNOSIS — H9192 Unspecified hearing loss, left ear: Secondary | ICD-10-CM

## 2014-07-13 DIAGNOSIS — D353 Benign neoplasm of craniopharyngeal duct: Secondary | ICD-10-CM

## 2014-07-13 LAB — LIPID PANEL
CHOLESTEROL: 158 mg/dL (ref 0–200)
HDL: 63 mg/dL (ref >39)
LDL Cholesterol: 75 mg/dL (ref 0–99)
Total CHOL/HDL Ratio: 2.5 Ratio
Triglycerides: 102 mg/dL (ref <150)
VLDL: 20 mg/dL (ref 0–40)

## 2014-07-13 LAB — GLUCOSE, CAPILLARY: Glucose-Capillary: 89 mg/dL (ref 70–99)

## 2014-07-13 NOTE — Assessment & Plan Note (Signed)
Gyn exam normal. Patient received pap smear today. She is up to date on all health maintenance issues.

## 2014-07-13 NOTE — Patient Instructions (Signed)
Barbara Estrada it was great to meet you today!  I am pleased to hear that things are going well for you.   Here are some of the things we discussed today: -Continue to exercise and eat a healthy diet -I will contact you or you can go onto MyChart to look at your results of the Pap and blood work. -Try and use a wax softener for your ear. See if this helps with the hearing decrease. If it does not I will consider referring you out but for now I think that is what the problem is.   Please schedule a follow-up appointment for one year or as needed. I am always here if anything comes up that you need an appointment for.   Thanks for allowing me to be a part of your care! Dr. Gerarda Fraction

## 2014-07-13 NOTE — Addendum Note (Signed)
Addended by: Katheren Shams on: 07/13/2014 11:40 AM   Modules accepted: Level of Service

## 2014-07-13 NOTE — Assessment & Plan Note (Addendum)
Patient follows with neurology yearly for this. They monitor her prolactin levels. She takes Cabergoline.

## 2014-07-13 NOTE — Assessment & Plan Note (Signed)
Dry Wax blocking ear canal. Instructed patient to try using some wax loosening formulations from the pharmacy. 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 Will re-evaluate at next visit. No need for referral at this time.

## 2014-07-13 NOTE — Assessment & Plan Note (Signed)
Patient has BMI of 26.4 this is up from 22.7 last year. Patient informed about exercising and healthy eating to maintain and decrease weight.

## 2014-07-13 NOTE — Progress Notes (Addendum)
Patient ID: LAKERA VIALL, female   DOB: Dec 30, 1965, 48 y.o.   MRN: [id number]     Subjective:  Chief Complaint  Patient presents with  . Establish Care  . Ear Problem    HPI: GISSEL KEILMAN is a 48 y.o. presenting to clinic today to establish as a new patient and have a well woman exam.   #Ear Problem(L): Patient states that she feels like there is a whooshing noise in her ear. This has been occuring off and on for the last month. More noticebale when she is laying on her right ear. She is concerned that she is losing her hearing. She denies any ringing in her ears or drainage.  #Menstraul irregularities -Patient has a history of uterine polyps. She states that when she had these her menstrual cycles were heavier. She is concerned that she may have polyps again. She says that her menstrual periods seem to be heavier. She bleeds for 3 days with the heaviest day being the first day. She also has associated cramping. Patient states that she is just now coming off her menstrual with today being the last day.  #Health Maintenance: Patient is due for Pap, Flu and Tetanus vaccination.  All systems were reviewed and were negative unless otherwise noted in the HPI Past Medical, Surgical, Social, and Family History Reviewed & Updated per EMR.   Objective: BP 122/79  Pulse 58  Temp(Src) 98.1 F (36.7 C) (Oral)  Wt 169 lb (76.658 kg)  LMP 07/09/2014  General: alert, well-developed, NAD, cooperative Head: normocephalic and atraumatic.  Eyes: vision grossly intact, PERRLA, no injection and anicteric.  Ear: normal TMs bilaterally, L. Ear with dry cerumen obstructing canal.  Mouth:   MMM, no erythema, no exudates, or lesions.   Neck: supple, full ROM, Lungs: CTAB, normal respiratory effort, no accessory muscle use, no crackles, and no wheezes. Heart: RRR, no M/R/G.  Abdomen: soft, NT, ND, BS present and normoactive, no guarding, no rebound tenderness, no hepatosplenomegaly, no palpable  masses Msk: no joint swelling, no joint warmth, and no redness over joints.  Neurologic: no neurologic deficits appreciated; A&Ox3 Skin: turgor normal and no rashes.  Genitalia:  Normal introitus for age, no external lesions, blood tinged mucous, mucosa pink and moist, no vaginal or cervical lesions, no vaginal atrophy, normal uterus size and position, no adnexal masses or tenderness.  Assessment/Plan: Please see problem based Assessment and Plan  Health Maintainance: Patient received Flu, Tetanus, and Pap smear today. She is now up to date.   Luiz Blare, DO 07/13/2014, 9:48 AM PGY-1, [place]

## 2014-07-14 LAB — CYTOLOGY - PAP

## 2014-07-14 NOTE — Addendum Note (Signed)
Addended by: Valerie Roys on: 07/14/2014 10:07 AM   Modules accepted: Orders

## 2014-07-17 ENCOUNTER — Encounter: Payer: Self-pay | Admitting: Obstetrics and Gynecology

## 2015-02-22 ENCOUNTER — Other Ambulatory Visit: Payer: Self-pay

## 2015-02-22 DIAGNOSIS — Z1231 Encounter for screening mammogram for malignant neoplasm of breast: Secondary | ICD-10-CM

## 2015-03-18 ENCOUNTER — Ambulatory Visit
Admission: RE | Admit: 2015-03-18 | Discharge: 2015-03-18 | Disposition: A | Discharge status: Home / Self Care | Payer: 59 | Source: Ambulatory Visit

## 2015-03-18 DIAGNOSIS — Z1231 Encounter for screening mammogram for malignant neoplasm of breast: Secondary | ICD-10-CM

## 2015-04-14 ENCOUNTER — Encounter: Payer: Self-pay | Admitting: Neurology

## 2015-04-14 ENCOUNTER — Ambulatory Visit (INDEPENDENT_AMBULATORY_CARE_PROVIDER_SITE_OTHER): Payer: 59 | Admitting: Neurology

## 2015-04-14 VITALS — BP 106/72 | HR 68 | Resp 14 | Ht 67.0 in | Wt 165.8 lb

## 2015-04-14 DIAGNOSIS — D352 Benign neoplasm of pituitary gland: Secondary | ICD-10-CM

## 2015-04-14 MED ORDER — CABERGOLINE 0.5 MG PO TABS: 0.5000 mg | ORAL_TABLET | ORAL | Status: DC

## 2015-04-14 NOTE — Progress Notes (Signed)
[facility]  PATIENT: Barbara Estrada DOB: 07-18-1966  REFERRING DOCTOR OR PCP:  Luiz Blare SOURCE: patient and records from [place] [facility]  _________________________________   HISTORICAL  CHIEF COMPLAINT:  Chief Complaint  Patient presents with  . Pituitary Tumor    Former pt. of Dr. Garth Bigness from [facility].  She has a hx. of a pituitary tumor, has mri's every 2 yrs as long as sx. are stable, and prolactin levels checked yearly.  She denies new sx. today--sts. has been fine./fim    HISTORY OF PRESENT ILLNESS:  Barbara Estrada is a 49 year old woman with past for a pituitary microadenoma.     About 15 years ago, she had irregular menstrual cycles and then her cycles stopped. An evaluation was performed and she was found to have an elevated prolactin level greater than 100. An MRI of the brain showed that she had a pituitary microadenoma. We placed her on Dostinex and she has continued on that medication. She tolerates it well and it quickly got her cycles back to regular pattern. She never had lactorrhea or visual field cut.   Since diagnosis we've been checking MRIs about every 2 years and the microadenoma has been stable. Additionally, the prolactin is checked every year and it has been in the normal range or just slightly elevated during most of this time.   However, her last prolactin was a little bit higher at 36.9  (normal 4.8-23.3) and an MRI was performed a little bit than usual but it was stable. The last MRI was 01/14/2014.  Over the past 6 months, she has had irregular menstrual cycles. In January of February, she did not have and they have been a little bit more irregular the last several months.   She has not had any hot flashes. She has not had any changes in her breasts or lactorrhea.  She denies any visual field cut or other visual changes.  She denies other significant neurologic problems. She gets rare headaches that can be treated  with OTC medications.  REVIEW OF SYSTEMS: Constitutional: No fevers, chills, sweats, or change in appetite Eyes: No visual changes, double vision, eye pain Ear, nose and throat: No hearing loss, ear pain, nasal congestion, sore throat Cardiovascular: No chest pain, palpitations Respiratory: No shortness of breath at rest or with exertion.   No wheezes GastrointestinaI: No nausea, vomiting, diarrhea, abdominal pain, fecal incontinence Genitourinary: No dysuria, urinary retention or frequency.  No nocturia. Musculoskeletal: No neck pain, back pain Integumentary: No rash, pruritus, skin lesions Neurological: as above Psychiatric: No depression at this time.  No anxiety Endocrine: No palpitations, diaphoresis, change in appetite, change in weigh or increased thirst Hematologic/Lymphatic: No anemia, purpura, petechiae. Allergic/Immunologic: No itchy/runny eyes, nasal congestion, recent allergic reactions, rashes  ALLERGIES: No Known Allergies  HOME MEDICATIONS:  Current outpatient prescriptions:  .  cabergoline (DOSTINEX) 0.5 MG tablet, Take 0.5 mg by mouth 3 (three) times a week. , Disp: , Rfl:  .  nitroGLYCERIN (NITRODUR - DOSED IN MG/24 HR) 0.2 mg/hr, Nitroglycerin Protocol  Apply 1/4 nitroglycerin patch to affected area daily. Change position of patch within the affected area every 24 hours., Disp: 30 patch, Rfl: 2  PAST MEDICAL HISTORY: Past Medical History  Diagnosis Date  . Ganglion cyst   . Vision abnormalities     PAST SURGICAL HISTORY: Past Surgical History  Procedure Laterality Date  . Ganglion cyst excision Bilateral     FAMILY HISTORY: Family History  Problem Relation Age of  Onset  . Hypertension Father   . Hyperlipidemia Neg Hx   . Diabetes Neg Hx   . Heart attack Neg Hx   . Sudden death Neg Hx   . Cancer Maternal Grandmother     She had brerast and bone cancer    SOCIAL HISTORY:  History   Social History  . Marital Status: Married    Spouse  Name: N/A  . Number of Children: N/A  . Years of Education: N/A   Occupational History  . Not on file.   Social History Main Topics  . Smoking status: Never Smoker   . Smokeless tobacco: Never Used  . Alcohol Use: 0.6 oz/week    1 Cans of beer per week  . Drug Use: No  . Sexual Activity: Yes    Birth Control/ Protection: Other-see comments, None     Comment: Husband had vasectomy   Other Topics Concern  . Not on file   Social History Narrative     PHYSICAL EXAM  Filed Vitals:   04/14/15 0848  BP: 106/72  Pulse: 68  Resp: 14  Height: 5\' 7"  (1.702 m)  Weight: 165 lb 12.8 oz (75.206 kg)    Body mass index is 25.96 kg/(m^2).   General: The patient is well-developed and well-nourished and in no acute distress  Eyes:  Funduscopic exam shows normal optic discs and retinal vessels.  Neck: The neck is supple .  The neck is nontender.  Skin: Extremities are without significant edema.  Musculoskeletal:  Back is nontender  Neurologic Exam  Mental status: The patient is alert and oriented x 3 at the time of the examination. The patient has apparent normal recent and remote memory, with an apparently normal attention span and concentration ability.   Speech is normal.  Cranial nerves: Extraocular movements are full. Pupils are equal, round, and reactive to light and accomodation.  Visual fields are full.  Facial symmetry is present. There is good facial sensation to soft touch bilaterally.Facial strength is normal.  Trapezius and sternocleidomastoid strength is normal. No dysarthria is noted.  The tongue is midline, and the patient has symmetric elevation of the soft palate. No obvious hearing deficits are noted.  Motor:  Muscle bulk is normal.   Tone is normal. Strength is  5 / 5 in all 4 extremities.   Sensory: Sensory testing is intact to touch in all 4 extremities.  Coordination: Cerebellar testing reveals good finger-nose-finger bilaterally.  Gait and station:  Station is normal.   Gait is normal. Tandem gait is normal. Romberg is negative.   Reflexes: Deep tendon reflexes are symmetric and normal bilaterally.     DIAGNOSTIC DATA (LABS, IMAGING, TESTING) - I reviewed patient records, labs, notes, testing and imaging myself where available.   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     Her periods have been more a regular the past 6 months gait, this is most likely to be due to menopause but we need to check a prolactin level m to make sure that the prolactin is not too high. If it is, we will go ahead and check  an MRI this year to ensure stability of the adenoma. If the prolactin is normal or near normal, then likely wait to next year to check a brain/pituitary MRI to assess stability.  Her carbergoline will be renewed at 1 pill 3 times a week  She will return to see me in 12 months or call sooner if she has new or worsening neurologic symptoms. We went over possible symptoms of the worsening including menstrual changes, lactorrhea and superior visual field cuts.   Coley Littles A. Felecia Shelling, MD, PhD 11/01/3565, 0:14 AM Certified in Neurology, Clinical Neurophysiology, Sleep Medicine, Pain Medicine and Neuroimaging  [facility] [address], [place], [place] [address] [phone]

## 2015-04-15 ENCOUNTER — Other Ambulatory Visit: Payer: Self-pay | Admitting: Neurology

## 2015-04-15 ENCOUNTER — Telehealth: Payer: Self-pay | Admitting: *Deleted

## 2015-04-15 DIAGNOSIS — D352 Benign neoplasm of pituitary gland: Secondary | ICD-10-CM

## 2015-04-15 DIAGNOSIS — R7989 Other specified abnormal findings of blood chemistry: Secondary | ICD-10-CM

## 2015-04-15 DIAGNOSIS — E229 Hyperfunction of pituitary gland, unspecified: Secondary | ICD-10-CM

## 2015-04-15 LAB — PROLACTIN: Prolactin: 38.5 ng/mL — ABNORMAL HIGH (ref 4.8–23.3)

## 2015-04-15 NOTE — Telephone Encounter (Signed)
I have spoken with Barbara Estrada this afternoon and per RAS, advised that Prolactin was a little high at 38.5  She verbalized understanding of same and is agreable to having mri.  She is aware to expect a call to schedule it once insurance has approved it [place]

## 2015-04-15 NOTE — Telephone Encounter (Signed)
-----   Message from Britt Bottom, MD sent at 04/15/2015  9:34 AM EDT ----- Please let her know that prolactin is a little higher than last time so I want to go ahead and check MRI  (I will place order in EMR)

## 2015-04-15 NOTE — Telephone Encounter (Signed)
Patient is returning a call returning her lab results.

## 2015-04-15 NOTE — Telephone Encounter (Signed)
LMTC./fim 

## 2015-04-15 NOTE — Telephone Encounter (Signed)
I have spoken with Barbara Estrada this afternoon and per RAS, advised that Prolactin level was a little high at 38.5.  She verbalized understanding of same and is agreeable to having mri.  I confirmed order for mri was placed, and forwarded a task to Seth Bake to make sure it doesn't get missed.  [place] is aware to expect a call to schedule once insurance has approved/fim

## 2015-08-20 ENCOUNTER — Ambulatory Visit (INDEPENDENT_AMBULATORY_CARE_PROVIDER_SITE_OTHER): Payer: 59 | Admitting: Family Medicine

## 2015-08-20 ENCOUNTER — Encounter: Payer: Self-pay | Admitting: Family Medicine

## 2015-08-20 VITALS — BP 134/77 | HR 81 | Ht 66.0 in | Wt 150.0 lb

## 2015-08-20 DIAGNOSIS — M722 Plantar fascial fibromatosis: Secondary | ICD-10-CM | POA: Diagnosis not present

## 2015-08-20 NOTE — Patient Instructions (Signed)
You have plantar fasciitis Take tylenol or aleve as needed for pain  Plantar fascia stretch for 20-30 seconds (do 3 of these) in morning Lowering/raise on a step exercises 3 x 10 once or twice a day - this is very important for long term recovery. Can add heel walks, toe walks forward and backward as well Ice heel for 15 minutes as needed. Avoid flat shoes/barefoot walking as much as possible. Arch straps have been shown to help with pain. Sports insoles with scaphoid pads - wear as often as possible. Steroid injection is a consideration for short term pain relief if you are struggling. Physical therapy is also an option. Can consider massage, active release, night splints, acupuncture though the data is not strong that these are helpful. Follow up with me in 6 weeks for reevaluation.

## 2015-08-24 DIAGNOSIS — M722 Plantar fascial fibromatosis: Secondary | ICD-10-CM | POA: Insufficient documentation

## 2015-08-24 NOTE — Progress Notes (Signed)
PCP: Luiz Blare, DO  Subjective:   HPI: Patient is a 49 y.o. female here for left heel pain.  Patient reports for 4 months she's had plantar left heel pain. Pain level 5/10. Started lateral foot, now plantar heel. Worse in morning, with walking. Has not run in 1 1/2 weeks due to sharp pain she gets here. No skin changes, fever, other complaints.  Past Medical History  Diagnosis Date  . Ganglion cyst   . Vision abnormalities     Current Outpatient Prescriptions on File Prior to Visit  Medication Sig Dispense Refill  . cabergoline (DOSTINEX) 0.5 MG tablet Take 1 tablet (0.5 mg total) by mouth 3 (three) times a week. 45 tablet 4  . nitroGLYCERIN (NITRODUR - DOSED IN MG/24 HR) 0.2 mg/hr Nitroglycerin Protocol  Apply 1/4 nitroglycerin patch to affected area daily. Change position of patch within the affected area every 24 hours. 30 patch 2   No current facility-administered medications on file prior to visit.    Past Surgical History  Procedure Laterality Date  . Ganglion cyst excision Bilateral     No Known Allergies  Social History   Social History  . Marital Status: Married    Spouse Name: N/A  . Number of Children: N/A  . Years of Education: N/A   Occupational History  . Not on file.   Social History Main Topics  . Smoking status: Never Smoker   . Smokeless tobacco: Never Used  . Alcohol Use: 0.6 oz/week    1 Cans of beer per week  . Drug Use: No  . Sexual Activity: Yes    Birth Control/ Protection: Other-see comments, None     Comment: Husband had vasectomy   Other Topics Concern  . Not on file   Social History Narrative    Family History  Problem Relation Age of Onset  . Hypertension Father   . Hyperlipidemia Neg Hx   . Diabetes Neg Hx   . Heart attack Neg Hx   . Sudden death Neg Hx   . Cancer Maternal Grandmother     She had brerast and bone cancer    BP 134/77 mmHg  Pulse 81  Ht 5\' 6"  (1.676 m)  Wt 150 lb (68.04 kg)  BMI 24.22  kg/m2  Review of Systems: See HPI above.    Objective:  Physical Exam:  Gen: NAD, comfortable in exam room.  Left foot/ankle: No gross deformity, swelling, ecchymoses FROM TTP plantar calcaneus at plantar fascia insertion. Negative ant drawer and talar tilt.   Negative syndesmotic compression. Negative calcaneal squeeze. Thompsons test negative. NV intact distally.  Right foot/ankle: FROM without pain.    Assessment & Plan:  1. Left plantar fasciitis - shown home exercises, stretches to do daily.  Arch binder, inserts with good arch supports.  Icing, nsaids if needed.  Consider PT, injection if not improving.  F/u in 6 weeks.

## 2015-08-24 NOTE — Assessment & Plan Note (Signed)
shown home exercises, stretches to do daily.  Arch binder, inserts with good arch supports.  Icing, nsaids if needed.  Consider PT, injection if not improving.  F/u in 6 weeks.

## 2015-11-29 ENCOUNTER — Encounter: Payer: Self-pay | Admitting: Family Medicine

## 2015-11-29 ENCOUNTER — Ambulatory Visit (INDEPENDENT_AMBULATORY_CARE_PROVIDER_SITE_OTHER): Payer: 59 | Admitting: Family Medicine

## 2015-11-29 VITALS — BP 143/88 | HR 57 | Ht 66.0 in | Wt 155.0 lb

## 2015-11-29 DIAGNOSIS — M25572 Pain in left ankle and joints of left foot: Secondary | ICD-10-CM

## 2015-11-29 NOTE — Patient Instructions (Signed)
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 Ice the area 15 minutes at a time 3-4 times a day. Ibuprofen 600mg  three times a day with food OR aleve 2 tabs twice a day with food for 7-10 days then as needed. Arch supports (dr. Zoe Lan active series, superfeet, or something similar) when up and walking around. Avoid barefoot walking or flat shoes as much as possible the next 6 weeks. Bracing is not helpful with this condition. Rehab exercises may make this worse. Cross train with cycling or swimming. I would avoid running for at least 2 weeks. Follow up with me in 6 weeks though see me sooner if you are hurting bad enough you would want a cortisone shot.

## 2015-11-30 DIAGNOSIS — M25572 Pain in left ankle and joints of left foot: Secondary | ICD-10-CM | POA: Insufficient documentation

## 2015-11-30 NOTE — Progress Notes (Signed)
PCP: Luiz Blare, DO  Subjective:   HPI: Patient is a 50 y.o. female here for left ankle pain.  Patient reports she first noticed pain mainly after a run on 2/4. Pain level up to 3/10 lateral ankle, sharp. + swelling. Tried icing and elevation. She did the [employer] relay over the weekend and felt a twinge later that day but did ok during the run. No skin changes, fever, other complaints.  Past Medical History  Diagnosis Date  . Ganglion cyst   . Vision abnormalities     Current Outpatient Prescriptions on File Prior to Visit  Medication Sig Dispense Refill  . cabergoline (DOSTINEX) 0.5 MG tablet Take 1 tablet (0.5 mg total) by mouth 3 (three) times a week. 45 tablet 4  . nitroGLYCERIN (NITRODUR - DOSED IN MG/24 HR) 0.2 mg/hr Nitroglycerin Protocol  Apply 1/4 nitroglycerin patch to affected area daily. Change position of patch within the affected area every 24 hours. 30 patch 2   No current facility-administered medications on file prior to visit.    Past Surgical History  Procedure Laterality Date  . Ganglion cyst excision Bilateral     No Known Allergies  Social History   Social History  . Marital Status: Married    Spouse Name: N/A  . Number of Children: N/A  . Years of Education: N/A   Occupational History  . Not on file.   Social History Main Topics  . Smoking status: Never Smoker   . Smokeless tobacco: Never Used  . Alcohol Use: 0.6 oz/week    1 Cans of beer per week  . Drug Use: No  . Sexual Activity: Yes    Birth Control/ Protection: Other-see comments, None     Comment: Husband had vasectomy   Other Topics Concern  . Not on file   Social History Narrative    Family History  Problem Relation Age of Onset  . Hypertension Father   . Hyperlipidemia Neg Hx   . Diabetes Neg Hx   . Heart attack Neg Hx   . Sudden death Neg Hx   . Cancer Maternal Grandmother     She had brerast and bone cancer    BP 143/88 mmHg  Pulse 57  Ht 5\' 6"   (1.676 m)  Wt 155 lb (70.308 kg)  BMI 25.03 kg/m2  Review of Systems: See HPI above.    Objective:  Physical Exam:  Gen: NAD  Left ankle: Mild swelling anterolateral ankle.  No bruising, other deformity.  Arches preserved. FROM with 5/5 strength all directions. TTP sinus tarsi.  No malleolar, other tenderness. Negative ant drawer and talar tilt.   Negative syndesmotic compression. Thompsons test negative. NV intact distally.  Right ankle: FROM without pain.  MSK u/s Left ankle: No cortical irregularities, edema overlying cortices, neovascularity of malleoli, base 5th, talus, elsewhere about ankle.  Hypoechoic area noted in sinus tarsi.  No other abnormalities.  Assessment & Plan:  1. Left ankle pain - consistent with sinus tarsi syndrome.  Discussed arch supports, icing, nsaids.  Arch supports (though her arches are well preserved).  Cross train in meantime.  Rest from running for about 2 weeks then walk:jog program.  Consider injection if not improving.  F/u in 6 weeks.

## 2015-11-30 NOTE — Assessment & Plan Note (Signed)
consistent with sinus tarsi syndrome.  Discussed arch supports, icing, nsaids.  Arch supports (though her arches are well preserved).  Cross train in meantime.  Rest from running for about 2 weeks then walk:jog program.  Consider injection if not improving.  F/u in 6 weeks.

## 2018-03-15 ENCOUNTER — Other Ambulatory Visit: Payer: Self-pay | Admitting: Physician Assistant

## 2018-03-15 ENCOUNTER — Other Ambulatory Visit (HOSPITAL_COMMUNITY)
Admission: RE | Admit: 2018-03-15 | Discharge: 2018-03-15 | Disposition: A | Discharge status: Home / Self Care | Payer: 59 | Source: Ambulatory Visit | Attending: Physician Assistant | Admitting: Physician Assistant

## 2018-03-15 DIAGNOSIS — Z01411 Encounter for gynecological examination (general) (routine) with abnormal findings: Secondary | ICD-10-CM | POA: Insufficient documentation

## 2018-03-15 DIAGNOSIS — Z0000 Encounter for general adult medical examination without abnormal findings: Secondary | ICD-10-CM | POA: Diagnosis not present

## 2018-03-15 DIAGNOSIS — D352 Benign neoplasm of pituitary gland: Secondary | ICD-10-CM | POA: Diagnosis not present

## 2018-03-15 DIAGNOSIS — Z13220 Encounter for screening for lipoid disorders: Secondary | ICD-10-CM | POA: Diagnosis not present

## 2018-03-19 LAB — CYTOLOGY - PAP
DIAGNOSIS: NEGATIVE
HPV: NOT DETECTED

## 2018-03-29 DIAGNOSIS — R944 Abnormal results of kidney function studies: Secondary | ICD-10-CM | POA: Diagnosis not present

## 2018-04-26 DIAGNOSIS — D443 Neoplasm of uncertain behavior of pituitary gland: Secondary | ICD-10-CM | POA: Diagnosis not present

## 2018-04-26 DIAGNOSIS — N912 Amenorrhea, unspecified: Secondary | ICD-10-CM | POA: Diagnosis not present

## 2018-04-29 ENCOUNTER — Ambulatory Visit (INDEPENDENT_AMBULATORY_CARE_PROVIDER_SITE_OTHER): Payer: 59 | Admitting: Family Medicine

## 2018-04-29 ENCOUNTER — Encounter: Payer: Self-pay | Admitting: Family Medicine

## 2018-04-29 DIAGNOSIS — M7662 Achilles tendinitis, left leg: Secondary | ICD-10-CM | POA: Diagnosis not present

## 2018-04-29 NOTE — Patient Instructions (Signed)
You have insertional achilles tendinitis. Ibuprofen 600mg  three times a day with food OR aleve 2 tabs twice a day with food for pain and inflammation - take for 7-10 days then as needed. Consider ice bucket 10-15 minutes at end of day - can ice 3-4 times a day at least. Avoid uneven ground, hills as much as possible. Run on track or treadmill with the heel lifts in. Cross train if necessary if pain is too severe. Heel lifts in shoes or shoes with a natural heel lift. Consider physical therapy, orthotics, nitro patches if not improving as expected. Follow up in 1 month.

## 2018-04-30 ENCOUNTER — Encounter: Payer: Self-pay | Admitting: Family Medicine

## 2018-04-30 DIAGNOSIS — M7662 Achilles tendinitis, left leg: Secondary | ICD-10-CM | POA: Insufficient documentation

## 2018-04-30 NOTE — Progress Notes (Signed)
PCP: Bufford Lope, DO  Subjective:   HPI: Patient is a 52 y.o. female here for left heel pain.  Patient reports for about 3 weeks now she's had posterior left heel pain. Pain currently 0/10. Worse before and after running. Pain is a burning. Has been icing which helps. No skin changes, numbness.  Past Medical History:  Diagnosis Date  . Ganglion cyst   . Vision abnormalities     Current Outpatient Medications on File Prior to Visit  Medication Sig Dispense Refill  . cabergoline (DOSTINEX) 0.5 MG tablet Take 1 tablet (0.5 mg total) by mouth 3 (three) times a week. 45 tablet 4  . nitroGLYCERIN (NITRODUR - DOSED IN MG/24 HR) 0.2 mg/hr Nitroglycerin Protocol  Apply 1/4 nitroglycerin patch to affected area daily. Change position of patch within the affected area every 24 hours. 30 patch 2   No current facility-administered medications on file prior to visit.     Past Surgical History:  Procedure Laterality Date  . GANGLION CYST EXCISION Bilateral     No Known Allergies  Social History   Socioeconomic History  . Marital status: Married    Spouse name: Not on file  . Number of children: Not on file  . Years of education: Not on file  . Highest education level: Not on file  Occupational History  . Not on file  Social Needs  . Financial resource strain: Not on file  . Food insecurity:    Worry: Not on file    Inability: Not on file  . Transportation needs:    Medical: Not on file    Non-medical: Not on file  Tobacco Use  . Smoking status: Never Smoker  . Smokeless tobacco: Never Used  Substance and Sexual Activity  . Alcohol use: Yes    Alcohol/week: 0.6 oz    Types: 1 Cans of beer per week  . Drug use: No  . Sexual activity: Yes    Birth control/protection: Other-see comments, None    Comment: Husband had vasectomy  Lifestyle  . Physical activity:    Days per week: Not on file    Minutes per session: Not on file  . Stress: Not on file  Relationships  .  Social connections:    Talks on phone: Not on file    Gets together: Not on file    Attends religious service: Not on file    Active member of club or organization: Not on file    Attends meetings of clubs or organizations: Not on file    Relationship status: Not on file  . Intimate partner violence:    Fear of current or ex partner: Not on file    Emotionally abused: Not on file    Physically abused: Not on file    Forced sexual activity: Not on file  Other Topics Concern  . Not on file  Social History Narrative  . Not on file    Family History  Problem Relation Age of Onset  . Hypertension Father   . Hyperlipidemia Neg Hx   . Diabetes Neg Hx   . Heart attack Neg Hx   . Sudden death Neg Hx   . Cancer Maternal Grandmother        She had brerast and bone cancer    BP (!) 148/75   Pulse 62   Ht 5\' 7"  (1.702 m)   Wt 170 lb (77.1 kg)   BMI 26.63 kg/m   Review of Systems: See HPI above.  Objective:  Physical Exam:  Gen: NAD, comfortable in exam room  Left foot/ankle: Haglund's deformity.  No gross deformity, swelling, ecchymoses. FROM with 5/5 strength but pain on full dorsiflexion. TTP at achilles insertion on calcaneus.  No other tenderness. Negative ant drawer and talar tilt.   Negative syndesmotic compression. Negative calcaneal squeeze. Thompsons test negative. NV intact distally.  Right foot/ankle: No deformity. FROM with 5/5 strength. No tenderness to palpation. NVI distally.   MSK u/s:  Calcific changes at insertion of achilles.  Mild retrocalcaneal bursitis.    Assessment & Plan:  1. Left achilles tendinitis - insertional.  Icing, ibuprofen or aleve for 7-10 days then as needed.  Heel lifts.  Avoid uneven ground, hills.  Consider physical therapy, orthotics, nitro patches.  F/u in 1 month.

## 2018-04-30 NOTE — Assessment & Plan Note (Signed)
insertional.  Icing, ibuprofen or aleve for 7-10 days then as needed.  Heel lifts.  Avoid uneven ground, hills.  Consider physical therapy, orthotics, nitro patches.  F/u in 1 month.

## 2018-05-01 ENCOUNTER — Other Ambulatory Visit: Payer: Self-pay | Admitting: Endocrinology

## 2018-05-01 DIAGNOSIS — N912 Amenorrhea, unspecified: Secondary | ICD-10-CM

## 2018-05-01 DIAGNOSIS — Z1231 Encounter for screening mammogram for malignant neoplasm of breast: Secondary | ICD-10-CM

## 2018-05-06 ENCOUNTER — Other Ambulatory Visit: Payer: Self-pay | Admitting: Endocrinology

## 2018-05-06 DIAGNOSIS — D443 Neoplasm of uncertain behavior of pituitary gland: Secondary | ICD-10-CM

## 2018-05-06 DIAGNOSIS — D444 Neoplasm of uncertain behavior of craniopharyngeal duct: Principal | ICD-10-CM

## 2018-05-06 DIAGNOSIS — N912 Amenorrhea, unspecified: Secondary | ICD-10-CM

## 2018-05-12 ENCOUNTER — Ambulatory Visit
Admission: RE | Admit: 2018-05-12 | Discharge: 2018-05-12 | Disposition: A | Discharge status: Home / Self Care | Payer: 59 | Source: Ambulatory Visit | Attending: Endocrinology | Admitting: Endocrinology

## 2018-05-12 DIAGNOSIS — D443 Neoplasm of uncertain behavior of pituitary gland: Secondary | ICD-10-CM | POA: Diagnosis not present

## 2018-05-12 DIAGNOSIS — N912 Amenorrhea, unspecified: Secondary | ICD-10-CM

## 2018-05-12 DIAGNOSIS — D444 Neoplasm of uncertain behavior of craniopharyngeal duct: Principal | ICD-10-CM

## 2018-05-12 MED ORDER — GADOBENATE DIMEGLUMINE 529 MG/ML IV SOLN
8.0000 mL | Freq: Once | INTRAVENOUS | Status: AC | PRN
  Administered 2018-05-12: 8 mL via INTRAVENOUS

## 2018-06-06 ENCOUNTER — Ambulatory Visit: Payer: 59 | Admitting: Family Medicine

## 2018-06-07 ENCOUNTER — Encounter: Payer: Self-pay | Admitting: Family Medicine

## 2018-06-07 ENCOUNTER — Ambulatory Visit (INDEPENDENT_AMBULATORY_CARE_PROVIDER_SITE_OTHER): Payer: 59 | Admitting: Family Medicine

## 2018-06-07 VITALS — BP 141/88 | HR 76 | Ht 67.0 in | Wt 170.0 lb

## 2018-06-07 DIAGNOSIS — M7662 Achilles tendinitis, left leg: Secondary | ICD-10-CM | POA: Diagnosis not present

## 2018-06-07 NOTE — Patient Instructions (Signed)
You're doing great! You have insertional achilles tendinitis. Avoid uneven ground, hills as much as possible. Strengthening with theraband 3 sets of 10 - as tolerated advance to 2 legged heel lifts, then 1 legged, then finally on a step. Walk/jog intervals as we discussed as you improve but again make sure these are on level surface. Cross train if necessary if pain is too severe. Heel lifts in shoes or shoes with a natural heel lift. Follow up with me in 6 weeks.

## 2018-06-09 ENCOUNTER — Encounter: Payer: Self-pay | Admitting: Family Medicine

## 2018-06-09 NOTE — Progress Notes (Signed)
PCP: Bufford Lope, DO  Subjective:   HPI: Patient is a 52 y.o. female here for left heel pain.  7/15: Patient reports for about 3 weeks now she's had posterior left heel pain. Pain currently 0/10. Worse before and after running. Pain is a burning. Has been icing which helps. No skin changes, numbness.  8/23: Patient reports she's doing very well. Pain level 0/10. She's using her heel lifts regularly. Has been icing. Rested for 2 weeks and is back to 3-4 miles walking 3 times a week. Not needing any medication. No skin changes.  Past Medical History:  Diagnosis Date  . Ganglion cyst   . Vision abnormalities     Current Outpatient Medications on File Prior to Visit  Medication Sig Dispense Refill  . cabergoline (DOSTINEX) 0.5 MG tablet Take 1 tablet (0.5 mg total) by mouth 3 (three) times a week. 45 tablet 4  . nitroGLYCERIN (NITRODUR - DOSED IN MG/24 HR) 0.2 mg/hr Nitroglycerin Protocol  Apply 1/4 nitroglycerin patch to affected area daily. Change position of patch within the affected area every 24 hours. 30 patch 2   No current facility-administered medications on file prior to visit.     Past Surgical History:  Procedure Laterality Date  . GANGLION CYST EXCISION Bilateral     No Known Allergies  Social History   Socioeconomic History  . Marital status: Married    Spouse name: Not on file  . Number of children: Not on file  . Years of education: Not on file  . Highest education level: Not on file  Occupational History  . Not on file  Social Needs  . Financial resource strain: Not on file  . Food insecurity:    Worry: Not on file    Inability: Not on file  . Transportation needs:    Medical: Not on file    Non-medical: Not on file  Tobacco Use  . Smoking status: Never Smoker  . Smokeless tobacco: Never Used  Substance and Sexual Activity  . Alcohol use: Yes    Alcohol/week: 1.0 standard drinks    Types: 1 Cans of beer per week  . Drug use: No   . Sexual activity: Yes    Birth control/protection: Other-see comments, None    Comment: Husband had vasectomy  Lifestyle  . Physical activity:    Days per week: Not on file    Minutes per session: Not on file  . Stress: Not on file  Relationships  . Social connections:    Talks on phone: Not on file    Gets together: Not on file    Attends religious service: Not on file    Active member of club or organization: Not on file    Attends meetings of clubs or organizations: Not on file    Relationship status: Not on file  . Intimate partner violence:    Fear of current or ex partner: Not on file    Emotionally abused: Not on file    Physically abused: Not on file    Forced sexual activity: Not on file  Other Topics Concern  . Not on file  Social History Narrative  . Not on file    Family History  Problem Relation Age of Onset  . Hypertension Father   . Hyperlipidemia Neg Hx   . Diabetes Neg Hx   . Heart attack Neg Hx   . Sudden death Neg Hx   . Cancer Maternal Grandmother  She had brerast and bone cancer    BP (!) 141/88   Pulse 76   Ht 5\' 7"  (1.702 m)   Wt 170 lb (77.1 kg)   BMI 26.63 kg/m   Review of Systems: See HPI above.     Objective:  Physical Exam:  Gen: NAD, comfortable in exam room  Left foot/ankle: Haglunds deformity.  No other swelling, ecchymoses FROM with 5/5 strength. TTP minimally insertion of achilles on calcaneus. Negative ant drawer and talar tilt.   Negative syndesmotic compression. Negative calcaneal squeeze. Thompsons test negative. NV intact distally.  Assessment & Plan:  1. Left achilles tendinitis - insertional.  Much improved compared to last visit.  Continue heel lifts.  Icing, ibuprofen or aleve if needed.  Add strengthening and reviewed how to advance these.  F/u in 6 weeks.

## 2018-06-13 ENCOUNTER — Ambulatory Visit
Admission: RE | Admit: 2018-06-13 | Discharge: 2018-06-13 | Disposition: A | Discharge status: Home / Self Care | Payer: 59 | Source: Ambulatory Visit | Attending: Endocrinology | Admitting: Endocrinology

## 2018-06-13 DIAGNOSIS — Z1231 Encounter for screening mammogram for malignant neoplasm of breast: Secondary | ICD-10-CM

## 2018-06-13 DIAGNOSIS — M8589 Other specified disorders of bone density and structure, multiple sites: Secondary | ICD-10-CM | POA: Diagnosis not present

## 2018-06-13 DIAGNOSIS — N912 Amenorrhea, unspecified: Secondary | ICD-10-CM

## 2018-06-13 DIAGNOSIS — Z780 Asymptomatic menopausal state: Secondary | ICD-10-CM | POA: Diagnosis not present

## 2018-06-17 ENCOUNTER — Encounter: Payer: Self-pay | Admitting: Family Medicine

## 2018-07-09 DIAGNOSIS — Z1211 Encounter for screening for malignant neoplasm of colon: Secondary | ICD-10-CM | POA: Diagnosis not present

## 2018-07-19 ENCOUNTER — Ambulatory Visit (INDEPENDENT_AMBULATORY_CARE_PROVIDER_SITE_OTHER): Payer: 59 | Admitting: Family Medicine

## 2018-07-19 ENCOUNTER — Encounter: Payer: Self-pay | Admitting: Family Medicine

## 2018-07-19 VITALS — BP 117/87 | HR 82 | Ht 67.0 in | Wt 170.0 lb

## 2018-07-19 DIAGNOSIS — M7662 Achilles tendinitis, left leg: Secondary | ICD-10-CM | POA: Diagnosis not present

## 2018-07-19 NOTE — Progress Notes (Signed)
PCP: Bufford Lope, DO  Subjective:   HPI: Patient is a 52 y.o. female here for left heel pain.  7/15: Patient reports for about 3 weeks now she's had posterior left heel pain. Pain currently 0/10. Worse before and after running. Pain is a burning. Has been icing which helps. No skin changes, numbness.  8/23: Patient reports she's doing very well. Pain level 0/10. She's using her heel lifts regularly. Has been icing. Rested for 2 weeks and is back to 3-4 miles walking 3 times a week. Not needing any medication. No skin changes.  10/4: Patient reports she's doing very well. Able to run 4 miles twice recently without any pain. Bought a couple pair of new shoes that have helped a lot. Has been icing some. Minimal soreness at most. No skin changes.  Past Medical History:  Diagnosis Date  . Ganglion cyst   . Vision abnormalities     Current Outpatient Medications on File Prior to Visit  Medication Sig Dispense Refill  . cabergoline (DOSTINEX) 0.5 MG tablet Take 1 tablet (0.5 mg total) by mouth 3 (three) times a week. 45 tablet 4  . nitroGLYCERIN (NITRODUR - DOSED IN MG/24 HR) 0.2 mg/hr Nitroglycerin Protocol  Apply 1/4 nitroglycerin patch to affected area daily. Change position of patch within the affected area every 24 hours. 30 patch 2   No current facility-administered medications on file prior to visit.     Past Surgical History:  Procedure Laterality Date  . GANGLION CYST EXCISION Bilateral     No Known Allergies  Social History   Socioeconomic History  . Marital status: Married    Spouse name: Not on file  . Number of children: Not on file  . Years of education: Not on file  . Highest education level: Not on file  Occupational History  . Not on file  Social Needs  . Financial resource strain: Not on file  . Food insecurity:    Worry: Not on file    Inability: Not on file  . Transportation needs:    Medical: Not on file    Non-medical: Not on  file  Tobacco Use  . Smoking status: Never Smoker  . Smokeless tobacco: Never Used  Substance and Sexual Activity  . Alcohol use: Yes    Alcohol/week: 1.0 standard drinks    Types: 1 Cans of beer per week  . Drug use: No  . Sexual activity: Yes    Birth control/protection: Other-see comments, None    Comment: Husband had vasectomy  Lifestyle  . Physical activity:    Days per week: Not on file    Minutes per session: Not on file  . Stress: Not on file  Relationships  . Social connections:    Talks on phone: Not on file    Gets together: Not on file    Attends religious service: Not on file    Active member of club or organization: Not on file    Attends meetings of clubs or organizations: Not on file    Relationship status: Not on file  . Intimate partner violence:    Fear of current or ex partner: Not on file    Emotionally abused: Not on file    Physically abused: Not on file    Forced sexual activity: Not on file  Other Topics Concern  . Not on file  Social History Narrative  . Not on file    Family History  Problem Relation Age of Onset  .  Hypertension Father   . Cancer Maternal Grandmother        She had brerast and bone cancer  . Hyperlipidemia Neg Hx   . Diabetes Neg Hx   . Heart attack Neg Hx   . Sudden death Neg Hx   . Breast cancer Neg Hx     BP 117/87   Pulse 82   Ht 5\' 7"  (1.702 m)   Wt 170 lb (77.1 kg)   BMI 26.63 kg/m   Review of Systems: See HPI above.     Objective:  Physical Exam:  Gen: NAD, comfortable in exam room  Left foot/ankle: Haglund's deformity.  No other swelling, ecchymoses FROM without pain No TTP Negative ant drawer and talar tilt.   Thompsons test negative. NV intact distally.  Assessment & Plan:  1. Left achilles tendinitis - insertional - much improved.  Continue with current shoes.  Home exercises 3-4 times a week for 6 more weeks.  F/u prn.

## 2018-07-19 NOTE — Patient Instructions (Signed)
You're doing great! Do the home exercises 3-4 times a week for 6 more weeks then as needed. Call me if you need anything otherwise follow up as needed.

## 2018-09-18 NOTE — Progress Notes (Signed)
Ms. Schlender received her flu shot to her LT deltoid at the [facility] on 12/3 by the undersigned. Lot#[id number] NDC: 57017-793-90 Mfg: [employer] Exp: 04/15/19

## 2019-05-02 ENCOUNTER — Other Ambulatory Visit: Payer: Self-pay | Admitting: Endocrinology

## 2019-05-02 DIAGNOSIS — E049 Nontoxic goiter, unspecified: Secondary | ICD-10-CM

## 2019-05-22 ENCOUNTER — Ambulatory Visit
Admission: RE | Admit: 2019-05-22 | Discharge: 2019-05-22 | Disposition: A | Discharge status: Home / Self Care | Payer: 59 | Source: Ambulatory Visit | Attending: Endocrinology | Admitting: Endocrinology

## 2019-05-22 DIAGNOSIS — E049 Nontoxic goiter, unspecified: Secondary | ICD-10-CM

## 2019-05-27 ENCOUNTER — Other Ambulatory Visit: Payer: Self-pay | Admitting: Endocrinology

## 2019-05-27 DIAGNOSIS — E049 Nontoxic goiter, unspecified: Secondary | ICD-10-CM

## 2019-06-19 ENCOUNTER — Ambulatory Visit
Admission: RE | Admit: 2019-06-19 | Discharge: 2019-06-19 | Disposition: A | Discharge status: Home / Self Care | Payer: 59 | Source: Ambulatory Visit | Attending: Endocrinology | Admitting: Endocrinology

## 2019-06-19 ENCOUNTER — Other Ambulatory Visit (HOSPITAL_COMMUNITY)
Admission: RE | Admit: 2019-06-19 | Discharge: 2019-06-19 | Disposition: A | Discharge status: Home / Self Care | Payer: 59 | Source: Ambulatory Visit | Attending: Radiology | Admitting: Radiology

## 2019-06-19 DIAGNOSIS — E049 Nontoxic goiter, unspecified: Secondary | ICD-10-CM | POA: Diagnosis present

## 2019-07-07 ENCOUNTER — Encounter (HOSPITAL_COMMUNITY): Payer: Self-pay

## 2020-04-27 ENCOUNTER — Other Ambulatory Visit: Payer: Self-pay | Admitting: Endocrinology

## 2020-04-27 DIAGNOSIS — E049 Nontoxic goiter, unspecified: Secondary | ICD-10-CM

## 2020-04-28 ENCOUNTER — Encounter: Payer: Self-pay | Admitting: Family Medicine

## 2020-04-28 ENCOUNTER — Other Ambulatory Visit: Payer: Self-pay

## 2020-04-28 ENCOUNTER — Ambulatory Visit (INDEPENDENT_AMBULATORY_CARE_PROVIDER_SITE_OTHER): Payer: 59 | Admitting: Family Medicine

## 2020-04-28 DIAGNOSIS — M25562 Pain in left knee: Secondary | ICD-10-CM

## 2020-04-28 NOTE — Progress Notes (Signed)
° ° °  SUBJECTIVE:   CHIEF COMPLAINT / HPI:   Left Knee Pain Patient is a 55y/o active runner. She presents today for 3 weeks of left knee pain. She states she was going on her normal run with her friend and she felt a "twinge" in her left knee on the lateral side. She stopped running that day and walked for the rest of the time. When she got home she elevated and iced her knee. She took OTC Tylenol and Ibuprofen which also helped. She did state she had some swelling and residual pain for a few days after but it was getting better. She started to run again but has been running less. She typically ran 15-18 miles total per week divided into 3 run sessions. Since her injury she has been running 12 miles total per week. She has never had this before. Overall, her pain has been well controlled with her interventions but yesterday she went to go up stairs and her left knee hurt very bad when all the weight was on just the leg. She also had the sensation that it was going to "give out" and it felt like something was "pulling on it".   PERTINENT  PMH / PSH: Hx of plantar fascitis and achilles tendinitis  OBJECTIVE:   BP 118/84    Ht 5\' 7"  (1.702 m)    Wt 165 lb (74.8 kg)    BMI 25.84 kg/m   Gen: NAD MSK: Knee, Left: Inspection was negative for erythema, ecchymosis, and mild effusion. No obvious bony abnormalities or signs of osteophyte development. Palpation yielded no asymmetric warmth; Positive for Lateral joint line tenderness; No condyle tenderness; No patellar tenderness; No patellar crepitus. Patellar and quadriceps tendons unremarkable, and no tenderness of the pes anserine bursa. No obvious Baker's cyst development. ROM normal in flexion (135 degrees) and extension (0 degrees). Normal hamstring and quadriceps strength. Neurovascularly intact bilaterally. - Cruciate Ligaments:   - Anterior Drawer:  NEG - Posterior Drawer: NEG   - Varus/Valgus Stress test: NEG  - Meniscus:   - Thessaly:  Positive   - McMurray's: Positive  - IT Band:   - Ober's test: NEG  Knee, right: Inspection was negative for erythema, ecchymosis, and effusion. No obvious bony abnormalities or signs of osteophyte development. Palpation yielded no asymmetric warmth; No joint line tenderness; No condyle tenderness; No patellar tenderness; Positive for patellar crepitus. Patellar and quadriceps tendons unremarkable, and no tenderness of the pes anserine bursa. No obvious Baker's cyst development. ROM normal in flexion (135 degrees) and extension (0 degrees). Normal hamstring and quadriceps strength. Neurovascularly intact bilaterally. Special Tests  - Cruciate Ligaments:   - Anterior Drawer:  NEG - Posterior Drawer: NEG   - Varus/Valgus Stress test: NEG  - Meniscus:   - Thessaly: Neg   - McMurray's: Neg   ASSESSMENT/PLAN:   Left knee pain Given lateral joint line tenderness, mild effusion of left knee, and positive mcmurray and thessaly test on the left she most likely has a lateral meniscus tear. No obvious tear or effusion around the lateral meniscus seen on ultrasound. She did have a mild suprapatellar effusion seen on [place]. - Quad strengthening exercises - Cont to use Helix knee brace during activity - 3 weeks of continuing decreased volume running, advance if pain free. - Tylenol, Aleve as needed for pain; ice and elevation after runs as needed - Return in 6 weeks      Barbara Estrada, Barbara Estrada

## 2020-04-28 NOTE — Patient Instructions (Addendum)
It was great to meet you today! Thank you for letting me participate in your care!  Today, we discussed your left knee pain which I believe is due to a meniscal tear. You should see improvement with continued rest, ice, and elevation when not active. When active please wear your Helix brace and continue to decrease your total run mileage for the next 3 weeks. If you have no pain while running you can start to increase it 1 mile per week max. Use Tylenol and Aleve as needed for pain and do the quadraceps strengthening exercises we showed you.  Please follow up with [place] in 6 weeks.  Be well, Harolyn Rutherford, DO PGY-4, Sports Medicine Fellow [place]

## 2020-04-28 NOTE — Assessment & Plan Note (Signed)
Given lateral joint line tenderness, mild effusion of left knee, and positive mcmurray and thessaly test on the left she most likely has a lateral meniscus tear. No obvious tear or effusion around the lateral meniscus seen on ultrasound. She did have a mild suprapatellar effusion seen on [place]. - Quad strengthening exercises - Cont to use Helix knee brace during activity - 3 weeks of continuing decreased volume running, advance if pain free. - Tylenol, Aleve as needed for pain; ice and elevation after runs as needed - Return in 6 weeks

## 2020-05-11 ENCOUNTER — Ambulatory Visit
Admission: RE | Admit: 2020-05-11 | Discharge: 2020-05-11 | Disposition: A | Discharge status: Home / Self Care | Payer: 59 | Source: Ambulatory Visit | Attending: Endocrinology | Admitting: Endocrinology

## 2020-05-11 DIAGNOSIS — E049 Nontoxic goiter, unspecified: Secondary | ICD-10-CM

## 2020-06-27 IMAGING — MG DIGITAL SCREENING BILATERAL MAMMOGRAM WITH TOMO AND CAD
6 series · 6 of 18 positions shown · non-contrast
Comparison: Previous exam(s).

CLINICAL DATA: Screening.

EXAM:
DIGITAL SCREENING BILATERAL MAMMOGRAM WITH TOMO AND CAD

[L MLO synth-2D]
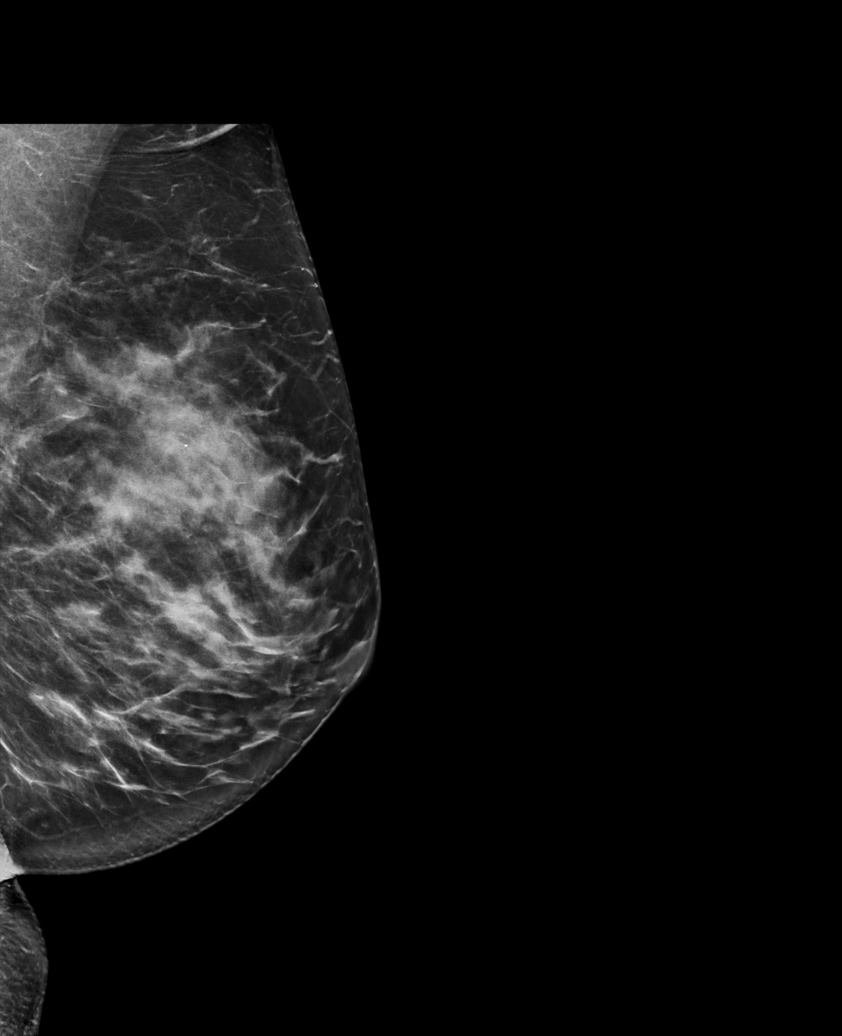

[R CC synth-2D]
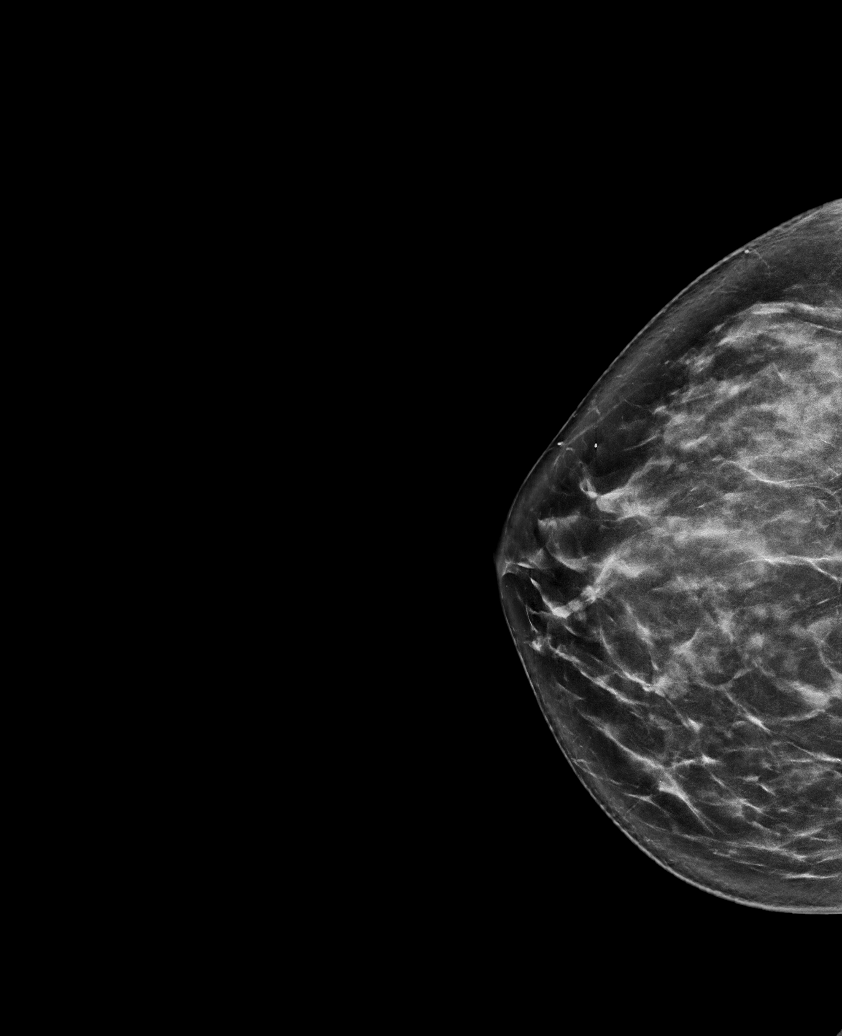

[R MLO synth-2D]
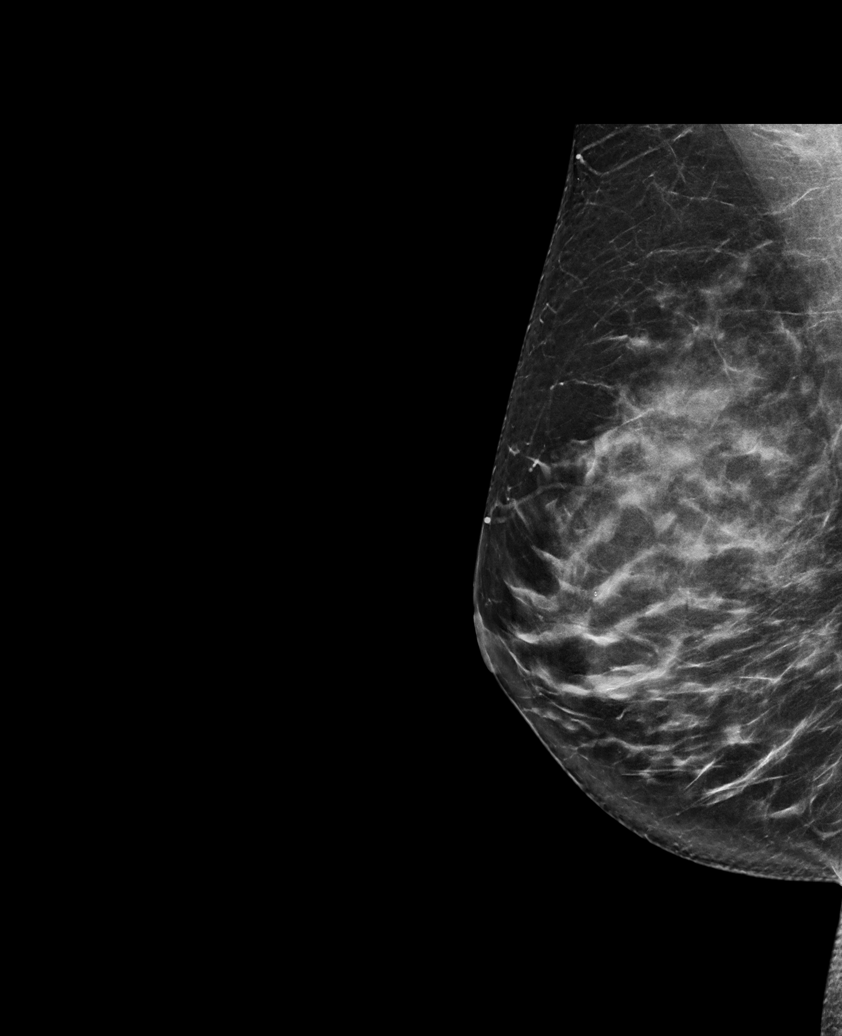

[L CC tomo · tomo slice 43/86.0]
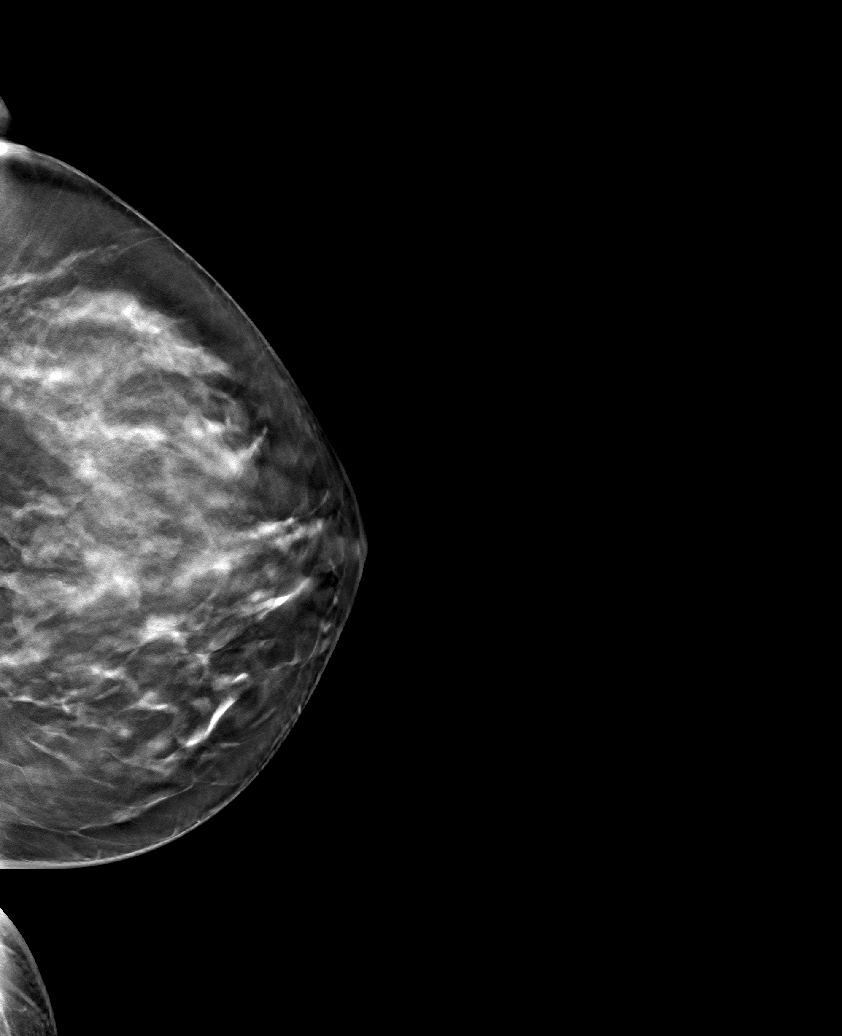

[L MLO tomo · tomo slice 41/80.0]
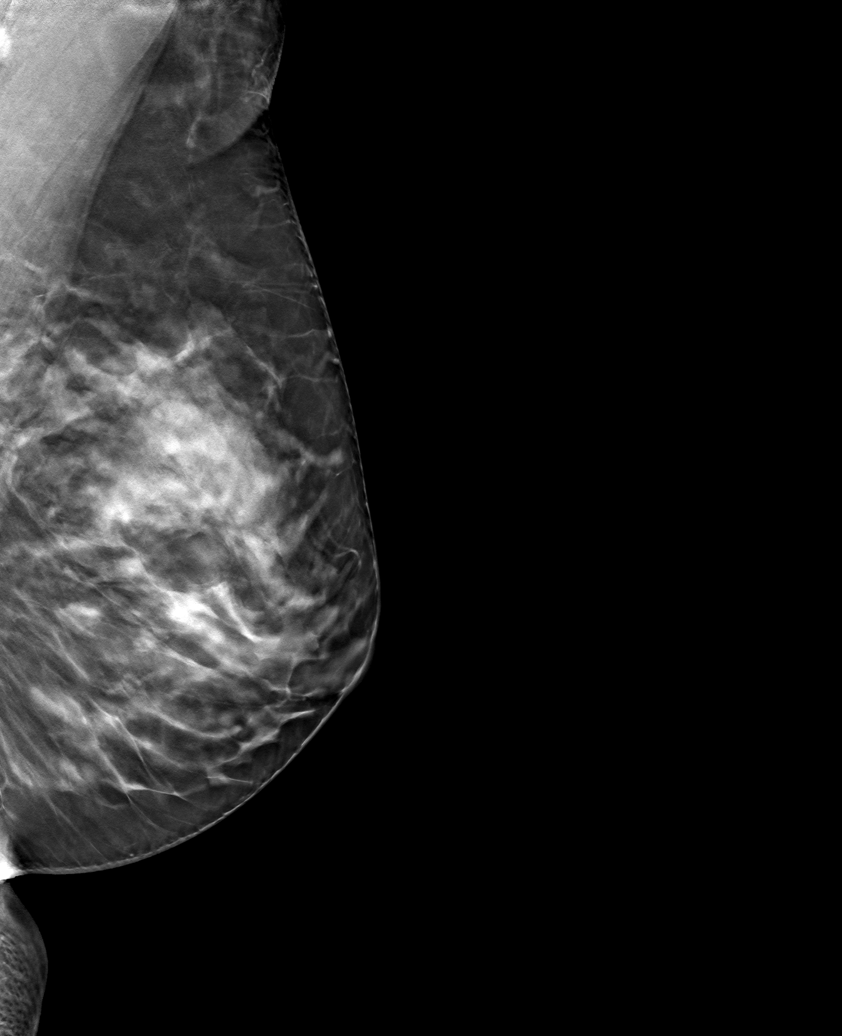

[R CC tomo · tomo slice 41/80.0]
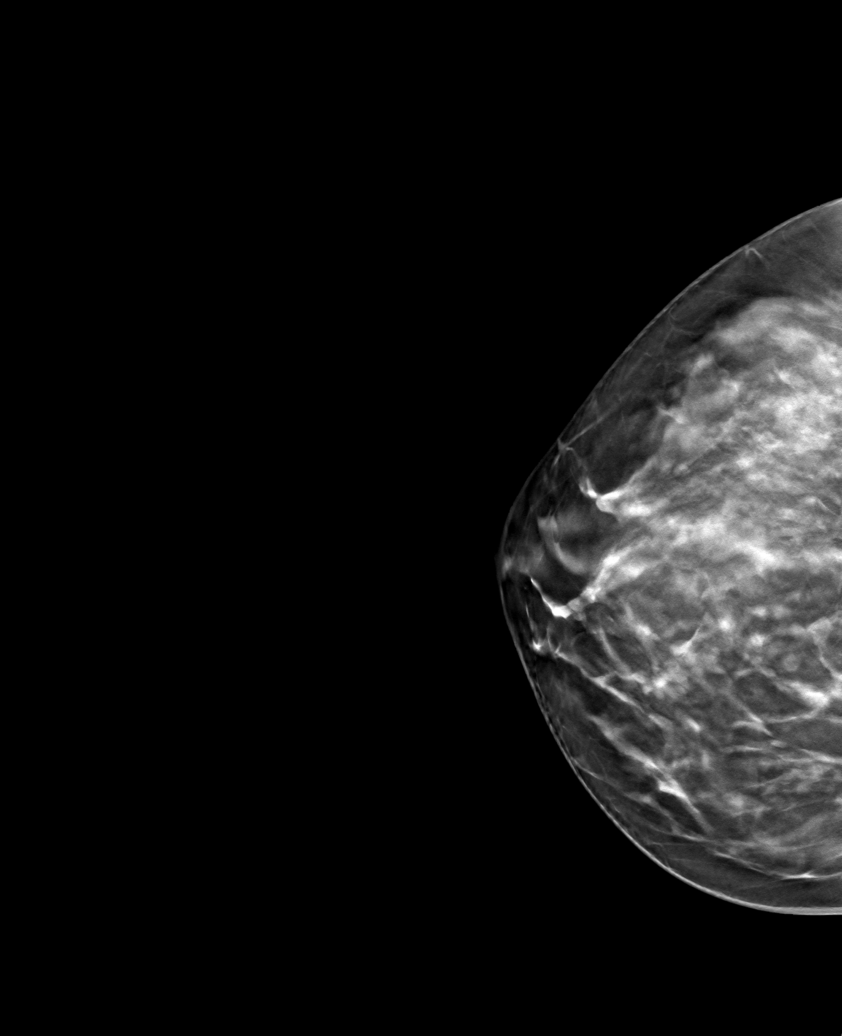

[6 of 18 positions shown; findings below may reference images not displayed]

ACR Breast Density Category c: The breast tissue is heterogeneously
dense, which may obscure small masses.
FINDINGS: There are no findings suspicious for malignancy. Images were
processed with CAD.
IMPRESSION: No mammographic evidence of malignancy. A result letter of this
screening mammogram will be mailed directly to the patient.

RECOMMENDATION:
Screening mammogram in one year. (Code:[id number])

BI-RADS CATEGORY  1: Negative.

## 2020-08-17 ENCOUNTER — Other Ambulatory Visit: Payer: Self-pay | Admitting: Physician Assistant

## 2020-08-17 DIAGNOSIS — Z1231 Encounter for screening mammogram for malignant neoplasm of breast: Secondary | ICD-10-CM

## 2020-08-20 ENCOUNTER — Other Ambulatory Visit: Payer: Self-pay

## 2020-08-20 ENCOUNTER — Ambulatory Visit
Admission: RE | Admit: 2020-08-20 | Discharge: 2020-08-20 | Disposition: A | Discharge status: Home / Self Care | Payer: 59 | Source: Ambulatory Visit

## 2020-08-20 DIAGNOSIS — Z1231 Encounter for screening mammogram for malignant neoplasm of breast: Secondary | ICD-10-CM

## 2021-07-25 ENCOUNTER — Ambulatory Visit (INDEPENDENT_AMBULATORY_CARE_PROVIDER_SITE_OTHER): Payer: 59 | Admitting: Family Medicine

## 2021-07-25 VITALS — Ht 67.0 in | Wt 175.0 lb

## 2021-07-25 DIAGNOSIS — M25562 Pain in left knee: Secondary | ICD-10-CM | POA: Diagnosis not present

## 2021-07-25 NOTE — Progress Notes (Signed)
PCP: Scifres, Dorothy, PA-C  Subjective:   HPI: Patient is a 55 y.o. female here for left knee pain.  Patient states she mostly improved from last visit last year when she had a lateral meniscus tear. However never went back to running and could still feel pain sometimes in left knee. Then last Sunday she felt a twinge within left knee more medially with associated swelling of her left knee. Feels pain more when going downhill. No catching or locking.  Past Medical History:  Diagnosis Date   Ganglion cyst    Vision abnormalities     Current Outpatient Medications on File Prior to Visit  Medication Sig Dispense Refill   cabergoline (DOSTINEX) 0.5 MG tablet Take 1 tablet (0.5 mg total) by mouth 3 (three) times a week. 45 tablet 4   No current facility-administered medications on file prior to visit.    Past Surgical History:  Procedure Laterality Date   GANGLION CYST EXCISION Bilateral     No Known Allergies  Ht 5\' 7"  (1.702 m)   Wt 175 lb (79.4 kg)   BMI 27.41 kg/m   [facility] 07/25/2021  Frequency of aerobic exercise (# of days/week) 4  Average time in minutes 60  Frequency of strengthening activities (# of days/week) 1    No flowsheet data found.      Objective:  Physical Exam:  Gen: NAD, comfortable in exam room  Left knee: Mild effusion.  No other gross deformity, ecchymoses. TTP medial joint lines. FROM with normal strength. Negative ant/post drawers. Negative valgus/varus testing. Negative lachman. Negative mcmurrays, apleys. Positive thessalys. NV intact distally.   Assessment & Plan:  1. Left knee pain - consistent with medial meniscus tear.  Home exercises reviewed.  Icing, compression sleeve.  Avoid squats, lunges, twisting motions for at least next 2 weeks.  Consider formal physical therapy, injection if not improving.  F/u in 6 weeks.

## 2021-07-25 NOTE — Patient Instructions (Signed)
You have a medial meniscus tear. Ice 15 minutes at a time 3-4 times a day. Compression sleeve during the day. Quad strengthening exercises daily. Avoid squats, lunges, twisting motions for minimum 2 weeks. Consider formal physical therapy, injection if you're not improving. If still having problems at 6 weeks follow up with me - would recommend imaging at that time.

## 2022-04-04 ENCOUNTER — Other Ambulatory Visit: Payer: Self-pay | Admitting: Endocrinology

## 2022-04-04 DIAGNOSIS — E049 Nontoxic goiter, unspecified: Secondary | ICD-10-CM

## 2022-04-27 ENCOUNTER — Other Ambulatory Visit: Payer: Self-pay | Admitting: Endocrinology

## 2022-04-27 DIAGNOSIS — M8580 Other specified disorders of bone density and structure, unspecified site: Secondary | ICD-10-CM

## 2022-05-11 ENCOUNTER — Ambulatory Visit
Admission: RE | Admit: 2022-05-11 | Discharge: 2022-05-11 | Disposition: A | Discharge status: Home / Self Care | Payer: 59 | Source: Ambulatory Visit | Attending: Endocrinology | Admitting: Endocrinology

## 2022-05-11 DIAGNOSIS — E049 Nontoxic goiter, unspecified: Secondary | ICD-10-CM

## 2022-05-24 ENCOUNTER — Other Ambulatory Visit: Payer: Self-pay | Admitting: Endocrinology

## 2022-05-24 DIAGNOSIS — E049 Nontoxic goiter, unspecified: Secondary | ICD-10-CM

## 2022-06-14 ENCOUNTER — Other Ambulatory Visit (HOSPITAL_COMMUNITY)
Admission: RE | Admit: 2022-06-14 | Discharge: 2022-06-14 | Disposition: A | Discharge status: Home / Self Care | Payer: 59 | Source: Ambulatory Visit | Attending: Endocrinology | Admitting: Endocrinology

## 2022-06-14 ENCOUNTER — Ambulatory Visit
Admission: RE | Admit: 2022-06-14 | Discharge: 2022-06-14 | Disposition: A | Discharge status: Home / Self Care | Payer: 59 | Source: Ambulatory Visit | Attending: Endocrinology | Admitting: Endocrinology

## 2022-06-14 DIAGNOSIS — E041 Nontoxic single thyroid nodule: Secondary | ICD-10-CM | POA: Insufficient documentation

## 2022-06-14 DIAGNOSIS — E049 Nontoxic goiter, unspecified: Secondary | ICD-10-CM

## 2022-06-16 LAB — CYTOLOGY - NON PAP

## 2022-07-06 ENCOUNTER — Encounter (HOSPITAL_COMMUNITY): Payer: Self-pay

## 2022-08-08 ENCOUNTER — Ambulatory Visit: Payer: Self-pay | Admitting: Surgery

## 2022-09-04 NOTE — Patient Instructions (Signed)
DUE TO COVID-19 ONLY TWO VISITORS  (aged 56 and older)  ARE ALLOWED TO COME WITH YOU AND STAY IN THE WAITING ROOM ONLY DURING PRE OP AND PROCEDURE.   **NO VISITORS ARE ALLOWED IN THE SHORT STAY AREA OR RECOVERY ROOM!!**  IF YOU WILL BE ADMITTED INTO THE HOSPITAL YOU ARE ALLOWED ONLY FOUR SUPPORT PEOPLE DURING VISITATION HOURS ONLY (7 AM -8PM)   The support person(s) must pass our screening, gel in and out, and wear a mask at all times, including in the patient's room. Patients must also wear a mask when staff or their support person are in the room. Visitors GUEST BADGE MUST BE WORN VISIBLY  One adult visitor may remain with you overnight and MUST be in the room by 8 P.M.     Your procedure is scheduled on: 09/18/22   Report to [facility] Main Entrance    Report to admitting at : 11;15 AM   Call this number if you have problems the morning of surgery [phone]   Do not eat food :After Midnight.   After Midnight you may have the following liquids until : 10:30 AM DAY OF SURGERY  Water Black Coffee (sugar ok, NO MILK/CREAM OR CREAMERS)  Tea (sugar ok, NO MILK/CREAM OR CREAMERS) regular and decaf                             Plain Jell-O (NO RED)                                           Fruit ices (not with fruit pulp, NO RED)                                     Popsicles (NO RED)                                                                  Juice: apple, WHITE grape, WHITE cranberry Sports drinks like Gatorade (NO RED)    Oral Hygiene is also important to reduce your risk of infection.                                    Remember - BRUSH YOUR TEETH THE MORNING OF SURGERY WITH YOUR REGULAR TOOTHPASTE   Do NOT smoke after Midnight   Take these medicines the morning of surgery with A SIP OF WATER: N/A  DO NOT TAKE ANY ORAL DIABETIC MEDICATIONS DAY OF YOUR SURGERY  Bring CPAP mask and tubing day of surgery.                              You may not have any metal on  your body including hair pins, jewelry, and body piercing             Do not wear make-up, lotions, powders, perfumes/cologne, or deodorant  Do not wear nail polish including gel  and S&S, artificial/acrylic nails, or any other type of covering on natural nails including finger and toenails. If you have artificial nails, gel coating, etc. that needs to be removed by a nail salon please have this removed prior to surgery or surgery may need to be canceled/ delayed if the surgeon/ anesthesia feels like they are unable to be safely monitored.   Do not shave  48 hours prior to surgery.    Do not bring valuables to the hospital. [place].   Contacts, dentures or bridgework may not be worn into surgery.   Bring small overnight bag day of surgery.   DO NOT [place]. PHARMACY WILL DISPENSE MEDICATIONS LISTED ON YOUR MEDICATION LIST TO YOU DURING YOUR ADMISSION [place]!    Patients discharged on the day of surgery will not be allowed to drive home.  Someone NEEDS to stay with you for the first 24 hours after anesthesia.   Special Instructions: Bring a copy of your healthcare power of attorney and living will documents         the day of surgery if you haven't scanned them before.              Please read over the following fact sheets you were given: IF YOU HAVE QUESTIONS ABOUT YOUR PRE-OP INSTRUCTIONS PLEASE CALL [phone]    [facility] - Preparing for Surgery Before surgery, you can play an important role.  Because skin is not sterile, your skin needs to be as free of germs as possible.  You can reduce the number of germs on your skin by washing with CHG (chlorahexidine gluconate) soap before surgery.  CHG is an antiseptic cleaner which kills germs and bonds with the skin to continue killing germs even after washing. Please DO NOT use if you have an allergy to CHG or antibacterial soaps.  If your skin  becomes reddened/irritated stop using the CHG and inform your nurse when you arrive at Short Stay. Do not shave (including legs and underarms) for at least 48 hours prior to the first CHG shower.  You may shave your face/neck. Please follow these instructions carefully:  1.  Shower with CHG Soap the night before surgery and the  morning of Surgery.  2.  If you choose to wash your hair, wash your hair first as usual with your  normal  shampoo.  3.  After you shampoo, rinse your hair and body thoroughly to remove the  shampoo.                           4.  Use CHG as you would any other liquid soap.  You can apply chg directly  to the skin and wash                       Gently with a scrungie or clean washcloth.  5.  Apply the CHG Soap to your body ONLY FROM THE NECK DOWN.   Do not use on face/ open                           Wound or open sores. Avoid contact with eyes, ears mouth and genitals (private parts).  Wash face,  Genitals (private parts) with your normal soap.             6.  Wash thoroughly, paying special attention to the area where your surgery  will be performed.  7.  Thoroughly rinse your body with warm water from the neck down.  8.  DO NOT shower/wash with your normal soap after using and rinsing off  the CHG Soap.                9.  Pat yourself dry with a clean towel.            10.  Wear clean pajamas.            11.  Place clean sheets on your bed the night of your first shower and do not  sleep with pets. Day of Surgery : Do not apply any lotions/deodorants the morning of surgery.  Please wear clean clothes to the hospital/surgery center.  FAILURE TO FOLLOW THESE INSTRUCTIONS MAY RESULT IN THE CANCELLATION OF YOUR SURGERY PATIENT SIGNATURE_________________________________  NURSE SIGNATURE__________________________________  ________________________________________________________________________[facility] - Preparing for Surgery Before surgery, you can  play an important role.  Because skin is not sterile, your skin needs to be as free of germs as possible.  You can reduce the number of germs on your skin by washing with CHG (chlorahexidine gluconate) soap before surgery.  CHG is an antiseptic cleaner which kills germs and bonds with the skin to continue killing germs even after washing. Please DO NOT use if you have an allergy to CHG or antibacterial soaps.  If your skin becomes reddened/irritated stop using the CHG and inform your nurse when you arrive at Short Stay. Do not shave (including legs and underarms) for at least 48 hours prior to the first CHG shower.  You may shave your face/neck. Please follow these instructions carefully:  1.  Shower with CHG Soap the night before surgery and the  morning of Surgery.  2.  If you choose to wash your hair, wash your hair first as usual with your  normal  shampoo.  3.  After you shampoo, rinse your hair and body thoroughly to remove the  shampoo.                           4.  Use CHG as you would any other liquid soap.  You can apply chg directly  to the skin and wash                       Gently with a scrungie or clean washcloth.  5.  Apply the CHG Soap to your body ONLY FROM THE NECK DOWN.   Do not use on face/ open                           Wound or open sores. Avoid contact with eyes, ears mouth and genitals (private parts).                       Wash face,  Genitals (private parts) with your normal soap.             6.  Wash thoroughly, paying special attention to the area where your surgery  will be performed.  7.  Thoroughly rinse your body with warm water from the neck down.  8.  DO NOT shower/wash  with your normal soap after using and rinsing off  the CHG Soap.                9.  Pat yourself dry with a clean towel.            10.  Wear clean pajamas.            11.  Place clean sheets on your bed the night of your first shower and do not  sleep with pets. Day of Surgery : Do not apply any  lotions/deodorants the morning of surgery.  Please wear clean clothes to the hospital/surgery center.  FAILURE TO FOLLOW THESE INSTRUCTIONS MAY RESULT IN THE CANCELLATION OF YOUR SURGERY PATIENT SIGNATURE_________________________________  NURSE SIGNATURE__________________________________  ________________________________________________________________________

## 2022-09-05 ENCOUNTER — Other Ambulatory Visit: Payer: Self-pay

## 2022-09-05 ENCOUNTER — Encounter (HOSPITAL_COMMUNITY)
Admission: RE | Admit: 2022-09-05 | Discharge: 2022-09-05 | Disposition: A | Discharge status: Home / Self Care | Payer: 59 | Source: Ambulatory Visit | Attending: Surgery | Admitting: Surgery

## 2022-09-05 ENCOUNTER — Encounter (HOSPITAL_COMMUNITY): Payer: Self-pay | Admitting: *Deleted

## 2022-09-05 ENCOUNTER — Ambulatory Visit (HOSPITAL_COMMUNITY)
Admission: RE | Admit: 2022-09-05 | Discharge: 2022-09-05 | Disposition: A | Discharge status: Home / Self Care | Payer: 59 | Source: Ambulatory Visit | Attending: Anesthesiology | Admitting: Anesthesiology

## 2022-09-05 VITALS — BP 143/95 | HR 67 | Temp 98.3°F | Ht 67.0 in | Wt 180.0 lb

## 2022-09-05 DIAGNOSIS — Z01818 Encounter for other preprocedural examination: Secondary | ICD-10-CM | POA: Insufficient documentation

## 2022-09-05 HISTORY — DX: Unspecified osteoarthritis, unspecified site: M19.90

## 2022-09-05 HISTORY — DX: Benign neoplasm of pituitary gland: D35.2

## 2022-09-05 LAB — TYPE AND SCREEN
ABO/RH(D): A POS
Antibody Screen: NEGATIVE

## 2022-09-05 LAB — CBC
HCT: 41.8 % (ref 36.0–46.0)
Hemoglobin: 13.6 g/dL (ref 12.0–15.0)
MCH: 29.9 pg (ref 26.0–34.0)
MCHC: 32.5 g/dL (ref 30.0–36.0)
MCV: 91.9 fL (ref 80.0–100.0)
Platelets: 256 10*3/uL (ref 150–400)
RBC: 4.55 MIL/uL (ref 3.87–5.11)
RDW: 12.9 % (ref 11.5–15.5)
WBC: 4.8 10*3/uL (ref 4.0–10.5)
nRBC: 0 % (ref 0.0–0.2)

## 2022-09-05 NOTE — Progress Notes (Signed)
For Short Stay: [place] appointment date:  Bowel Prep reminder:   For Anesthesia: PCP - Earlie Server Scifres: PA Cardiologist -   Chest x-ray -  EKG -  Stress Test -  ECHO -  Cardiac Cath -  Pacemaker/ICD device last checked: Pacemaker orders received: Device Rep notified:  Spinal Cord Stimulator:  Sleep Study -  CPAP -   Fasting Blood Sugar -  Checks Blood Sugar _____ times a day Date and result of last Hgb A1c-  Last dose of GLP1 agonist-  GLP1 instructions:   Last dose of SGLT-2 inhibitors-  SGLT-2 instructions:   Blood Thinner Instructions: Aspirin Instructions: Last Dose:  Activity level: Can go up a flight of stairs and activities of daily living without stopping and without chest pain and/or shortness of breath   Able to exercise without chest pain and/or shortness of breath   Unable to go up a flight of stairs without chest pain and/or shortness of breath     Anesthesia review:   Patient denies shortness of breath, fever, cough and chest pain at PAT appointment   Patient verbalized understanding of instructions that were given to them at the PAT appointment. Patient was also instructed that they will need to review over the PAT instructions again at home before surgery.

## 2022-09-16 ENCOUNTER — Encounter (HOSPITAL_COMMUNITY): Payer: Self-pay | Admitting: Surgery

## 2022-09-16 DIAGNOSIS — E042 Nontoxic multinodular goiter: Secondary | ICD-10-CM | POA: Diagnosis present

## 2022-09-16 DIAGNOSIS — D440 Neoplasm of uncertain behavior of thyroid gland: Secondary | ICD-10-CM | POA: Diagnosis present

## 2022-09-16 NOTE — H&P (Signed)
REFERRING PHYSICIAN: Birdena Crandall, MD  PROVIDER: Delance Weide Charlotta [place], MD   Chief Complaint: New Consultation (Thyroid neoplasm of uncertain behavior, multiple thyroid nodules)  History of Present Illness:  Patient is referred by Dr. Jacelyn Pi for surgical evaluation and management of a thyroid neoplasm of uncertain behavior, Bethesda category V, in the setting of multiple thyroid nodules. Patient had been originally diagnosed while seeing Dr. Chalmers Cater management of a pituitary adenoma. She was noted on physical examination to have thyroid nodules. She has undergone sequential ultrasound examinations. Her most recent study was in July 2023. A left inferior nodule measuring 1.7 cm was felt to be suspicious and fine-needle aspiration biopsy was recommended. This was performed on June 14, 2022. Cytopathology came back as suspicious for malignancy. Based on her Bethesda category, this has a risk of cancer of approximately 90%. Patient has normal thyroid function. Her TSH level is 0.951. She has never been on thyroid medication. She has had no prior head or neck surgery. There is no family history of thyroid disease. Patient is accompanied today by her husband. She works in Programmer, applications for [employer].  Review of Systems: A complete review of systems was obtained from the patient. I have reviewed this information and discussed as appropriate with the patient. See HPI as well for other ROS.  Review of Systems Constitutional: Negative. HENT: Negative. Eyes: Negative. Respiratory: Negative. Cardiovascular: Negative. Gastrointestinal: Negative. Genitourinary: Negative. Musculoskeletal: Negative. Skin: Negative. Neurological: Negative. Endo/Heme/Allergies: Negative. Psychiatric/Behavioral: Negative.   Medical History: History reviewed. No pertinent past medical history.  Patient Active Problem List Diagnosis Neoplasm of uncertain behavior of thyroid gland Multiple  thyroid nodules  History reviewed. No pertinent surgical history.  No Known Allergies  No current outpatient medications on file prior to visit.  No current facility-administered medications on file prior to visit.  Family History Problem Relation Age of Onset Hyperlipidemia (Elevated cholesterol) Father   Social History  Tobacco Use Smoking Status Never Smokeless Tobacco Never   Social History  Socioeconomic History Marital status: Married Tobacco Use Smoking status: Never Smokeless tobacco: Never Substance and Sexual Activity Alcohol use: Yes Alcohol/week: 1.0 standard drink of alcohol Types: 1 Glasses of wine per week Drug use: Never  Objective:  Vitals: BP: 115/80 Pulse: 85 Temp: 36.7 C (98 F) SpO2: 98% Weight: 84.4 kg (186 lb) Height: 170.2 cm ('5\' 7"'$ )  Body mass index is 29.13 kg/m.  Physical Exam  GENERAL APPEARANCE Comfortable, no acute issues Development: normal Gross deformities: none  SKIN Rash, lesions, ulcers: none Induration, erythema: none Nodules: none palpable  EYES Conjunctiva and lids: normal Pupils: equal and reactive  EARS, NOSE, MOUTH, THROAT External ears: no lesion or deformity External nose: no lesion or deformity Hearing: grossly normal  NECK Symmetric: yes Trachea: midline Thyroid: Right gland is essentially normal in size and relatively soft bilaterally. There is mild nodularity palpable without discrete or dominant mass. There is no associated lymphadenopathy.  CHEST Respiratory effort: normal Retraction or accessory muscle use: no Breath sounds: normal bilaterally Rales, rhonchi, wheeze: none  CARDIOVASCULAR Auscultation: regular rhythm, normal rate Murmurs: none Pulses: radial pulse 2+ palpable Lower extremity edema: none  ABDOMEN Not assessed  GENITOURINARY/RECTAL Not assessed  MUSCULOSKELETAL Station and gait: normal Digits and nails: no clubbing or cyanosis Muscle strength: grossly normal  all extremities Range of motion: grossly normal all extremities Deformity: none  LYMPHATIC Cervical: none palpable Supraclavicular: none palpable  PSYCHIATRIC Oriented to person, place, and time: yes Mood and affect: normal  for situation Judgment and insight: appropriate for situation  Assessment and Plan:  Neoplasm of uncertain behavior of thyroid gland Multiple thyroid nodules  Patient presents on referral from her endocrinologist for surgical evaluation and management of a thyroid neoplasm of uncertain behavior suspicious for carcinoma in the setting of multiple thyroid nodules.  Patient provided with a copy of "The Thyroid Book: Medical and Surgical Treatment of Thyroid Problems", published by [employer], 16 pages. Book reviewed and explained to patient during visit today.  Today we discussed her clinical history. We reviewed her ultrasound report. We reviewed her cytopathology results. Based on these findings, I have recommended proceeding with total thyroidectomy for definitive diagnosis and management. We discussed the fact that there is about a 90% chance of malignancy, but there is a 10% chance that this will be benign. However there are multiple other thyroid nodules present. I think the best choice for management is total thyroidectomy. We discussed the risk and benefits of the procedure. We discussed risk of recurrent laryngeal nerve injury and injury to parathyroid glands. We discussed the size and location of the surgical incision. We discussed the hospital stay to be anticipated. We discussed the need for lifelong thyroid hormone replacement. We discussed the potential need for radioactive iodine treatment. The patient understands and wishes to proceed with surgery in the near future. We will make arrangements at a time convenient for the patient.  Armandina Gemma, MD [facility] [place] practice Office: [phone]

## 2022-09-18 ENCOUNTER — Encounter (HOSPITAL_COMMUNITY): Payer: Self-pay | Admitting: Surgery

## 2022-09-18 ENCOUNTER — Other Ambulatory Visit: Payer: Self-pay

## 2022-09-18 ENCOUNTER — Ambulatory Visit (HOSPITAL_COMMUNITY): Payer: 59 | Admitting: Anesthesiology

## 2022-09-18 ENCOUNTER — Ambulatory Visit (HOSPITAL_COMMUNITY)
Admission: RE | Admit: 2022-09-18 | Discharge: 2022-09-19 | Disposition: A | Discharge status: Home / Self Care | Payer: 59 | Attending: Surgery | Admitting: Surgery

## 2022-09-18 ENCOUNTER — Encounter (HOSPITAL_COMMUNITY)
Admission: RE | Disposition: A | Discharge status: Home / Self Care | Payer: Self-pay | Source: Home / Self Care | Attending: Surgery

## 2022-09-18 ENCOUNTER — Ambulatory Visit (HOSPITAL_BASED_OUTPATIENT_CLINIC_OR_DEPARTMENT_OTHER): Payer: 59 | Admitting: Anesthesiology

## 2022-09-18 DIAGNOSIS — E042 Nontoxic multinodular goiter: Secondary | ICD-10-CM | POA: Insufficient documentation

## 2022-09-18 DIAGNOSIS — D440 Neoplasm of uncertain behavior of thyroid gland: Secondary | ICD-10-CM

## 2022-09-18 DIAGNOSIS — Z01818 Encounter for other preprocedural examination: Secondary | ICD-10-CM

## 2022-09-18 HISTORY — PX: THYROIDECTOMY: SHX17

## 2022-09-18 LAB — ABO/RH: ABO/RH(D): A POS

## 2022-09-18 SURGERY — THYROIDECTOMY
Anesthesia: General | Site: Neck

## 2022-09-18 MED ORDER — HYDROMORPHONE HCL 1 MG/ML IJ SOLN: 1.0000 mg | INTRAMUSCULAR | Status: DC | PRN

## 2022-09-18 MED ORDER — ROCURONIUM BROMIDE 10 MG/ML (PF) SYRINGE
PREFILLED_SYRINGE | INTRAVENOUS | Status: DC | PRN
  Administered 2022-09-18: 50 mg via INTRAVENOUS
  Administered 2022-09-18: 10 mg via INTRAVENOUS

## 2022-09-18 MED ORDER — CEFAZOLIN SODIUM-DEXTROSE 2-4 GM/100ML-% IV SOLN
2.0000 g | INTRAVENOUS | Status: AC
  Administered 2022-09-18: 2 g via INTRAVENOUS
  Filled 2022-09-18: qty 100

## 2022-09-18 MED ORDER — ORAL CARE MOUTH RINSE: 15.0000 mL | Freq: Once | OROMUCOSAL | Status: AC

## 2022-09-18 MED ORDER — DEXAMETHASONE SODIUM PHOSPHATE 10 MG/ML IJ SOLN
INTRAMUSCULAR | Status: DC | PRN
  Administered 2022-09-18: 10 mg via INTRAVENOUS

## 2022-09-18 MED ORDER — SUGAMMADEX SODIUM 200 MG/2ML IV SOLN
INTRAVENOUS | Status: DC | PRN
  Administered 2022-09-18: 200 mg via INTRAVENOUS

## 2022-09-18 MED ORDER — HEMOSTATIC AGENTS (NO CHARGE) OPTIME
TOPICAL | Status: DC | PRN
  Administered 2022-09-18: 1 via TOPICAL

## 2022-09-18 MED ORDER — CALCIUM CARBONATE 1250 (500 CA) MG PO TABS
2.0000 | ORAL_TABLET | Freq: Three times a day (TID) | ORAL | Status: DC
  Administered 2022-09-19 (×2): 2500 mg via ORAL
  Filled 2022-09-18 (×2): qty 2

## 2022-09-18 MED ORDER — MIDAZOLAM HCL 5 MG/5ML IJ SOLN
INTRAMUSCULAR | Status: DC | PRN
  Administered 2022-09-18: 2 mg via INTRAVENOUS

## 2022-09-18 MED ORDER — FENTANYL CITRATE PF 50 MCG/ML IJ SOSY: 25.0000 ug | PREFILLED_SYRINGE | INTRAMUSCULAR | Status: DC | PRN

## 2022-09-18 MED ORDER — ONDANSETRON 4 MG PO TBDP: 4.0000 mg | ORAL_TABLET | Freq: Four times a day (QID) | ORAL | Status: DC | PRN

## 2022-09-18 MED ORDER — CHLORHEXIDINE GLUCONATE 0.12 % MT SOLN
15.0000 mL | Freq: Once | OROMUCOSAL | Status: AC
  Administered 2022-09-18: 15 mL via OROMUCOSAL

## 2022-09-18 MED ORDER — OXYCODONE HCL 5 MG PO TABS: 5.0000 mg | ORAL_TABLET | ORAL | Status: DC | PRN

## 2022-09-18 MED ORDER — CHLORHEXIDINE GLUCONATE CLOTH 2 % EX PADS: 6.0000 | MEDICATED_PAD | Freq: Once | CUTANEOUS | Status: DC

## 2022-09-18 MED ORDER — ONDANSETRON HCL 4 MG/2ML IJ SOLN
INTRAMUSCULAR | Status: AC
  Filled 2022-09-18: qty 2

## 2022-09-18 MED ORDER — HYDRALAZINE HCL 20 MG/ML IJ SOLN
INTRAMUSCULAR | Status: AC
  Filled 2022-09-18: qty 1

## 2022-09-18 MED ORDER — ACETAMINOPHEN 650 MG RE SUPP: 650.0000 mg | Freq: Four times a day (QID) | RECTAL | Status: DC | PRN

## 2022-09-18 MED ORDER — ONDANSETRON HCL 4 MG/2ML IJ SOLN
4.0000 mg | Freq: Four times a day (QID) | INTRAMUSCULAR | Status: DC | PRN
  Administered 2022-09-18: 4 mg via INTRAVENOUS
  Filled 2022-09-18: qty 2

## 2022-09-18 MED ORDER — PROPOFOL 10 MG/ML IV BOLUS
INTRAVENOUS | Status: AC
  Filled 2022-09-18: qty 20

## 2022-09-18 MED ORDER — TRAMADOL HCL 50 MG PO TABS: 50.0000 mg | ORAL_TABLET | Freq: Four times a day (QID) | ORAL | Status: DC | PRN

## 2022-09-18 MED ORDER — ACETAMINOPHEN 10 MG/ML IV SOLN: 1000.0000 mg | Freq: Once | INTRAVENOUS | Status: DC | PRN

## 2022-09-18 MED ORDER — DEXAMETHASONE SODIUM PHOSPHATE 10 MG/ML IJ SOLN
INTRAMUSCULAR | Status: AC
  Filled 2022-09-18: qty 1

## 2022-09-18 MED ORDER — LACTATED RINGERS IV SOLN: INTRAVENOUS | Status: DC

## 2022-09-18 MED ORDER — 0.9 % SODIUM CHLORIDE (POUR BTL) OPTIME
TOPICAL | Status: DC | PRN
  Administered 2022-09-18: 1000 mL

## 2022-09-18 MED ORDER — HYDRALAZINE HCL 20 MG/ML IJ SOLN
5.0000 mg | Freq: Once | INTRAMUSCULAR | Status: AC
  Administered 2022-09-18: 5 mg via INTRAVENOUS

## 2022-09-18 MED ORDER — LIDOCAINE HCL (PF) 2 % IJ SOLN
INTRAMUSCULAR | Status: AC
  Filled 2022-09-18: qty 5

## 2022-09-18 MED ORDER — LIDOCAINE 2% (20 MG/ML) 5 ML SYRINGE
INTRAMUSCULAR | Status: DC | PRN
  Administered 2022-09-18: 60 mg via INTRAVENOUS

## 2022-09-18 MED ORDER — MIDAZOLAM HCL 2 MG/2ML IJ SOLN
INTRAMUSCULAR | Status: AC
  Filled 2022-09-18: qty 2

## 2022-09-18 MED ORDER — FENTANYL CITRATE (PF) 250 MCG/5ML IJ SOLN
INTRAMUSCULAR | Status: AC
  Filled 2022-09-18: qty 5

## 2022-09-18 MED ORDER — ONDANSETRON HCL 4 MG/2ML IJ SOLN
INTRAMUSCULAR | Status: DC | PRN
  Administered 2022-09-18: 4 mg via INTRAVENOUS

## 2022-09-18 MED ORDER — ACETAMINOPHEN 325 MG PO TABS
650.0000 mg | ORAL_TABLET | Freq: Four times a day (QID) | ORAL | Status: DC | PRN
  Administered 2022-09-19: 650 mg via ORAL
  Filled 2022-09-18: qty 2

## 2022-09-18 MED ORDER — PROPOFOL 10 MG/ML IV BOLUS
INTRAVENOUS | Status: DC | PRN
  Administered 2022-09-18: 150 mg via INTRAVENOUS

## 2022-09-18 MED ORDER — SODIUM CHLORIDE 0.45 % IV SOLN: INTRAVENOUS | Status: DC

## 2022-09-18 MED ORDER — FENTANYL CITRATE (PF) 250 MCG/5ML IJ SOLN
INTRAMUSCULAR | Status: DC | PRN
  Administered 2022-09-18 (×5): 50 ug via INTRAVENOUS

## 2022-09-18 SURGICAL SUPPLY — 34 items
ADH SKN CLS APL DERMABOND .7 (GAUZE/BANDAGES/DRESSINGS) ×1
APL PRP STRL LF DISP 70% ISPRP (MISCELLANEOUS) ×1
ATTRACTOMAT 16X20 MAGNETIC DRP (DRAPES) ×1 IMPLANT
BAG COUNTER SPONGE SURGICOUNT (BAG) ×1 IMPLANT
BAG SPNG CNTER NS LX DISP (BAG) ×1
BLADE SURG 15 STRL LF DISP TIS (BLADE) ×1 IMPLANT
BLADE SURG 15 STRL SS (BLADE) ×1
CHLORAPREP W/TINT 26 (MISCELLANEOUS) ×1 IMPLANT
CLIP TI MEDIUM 6 (CLIP) ×2 IMPLANT
CLIP TI WIDE RED SMALL 6 (CLIP) ×2 IMPLANT
COVER SURGICAL LIGHT HANDLE (MISCELLANEOUS) ×1 IMPLANT
DERMABOND ADVANCED .7 DNX12 (GAUZE/BANDAGES/DRESSINGS) ×1 IMPLANT
DRAPE LAPAROTOMY T 98X78 PEDS (DRAPES) ×1 IMPLANT
DRAPE UTILITY XL STRL (DRAPES) ×1 IMPLANT
ELECT PENCIL ROCKER SW 15FT (MISCELLANEOUS) ×1 IMPLANT
ELECT REM PT RETURN 15FT ADLT (MISCELLANEOUS) ×1 IMPLANT
GAUZE 4X4 16PLY ~~LOC~~+RFID DBL (SPONGE) ×1 IMPLANT
GLOVE SURG ORTHO 8.0 STRL STRW (GLOVE) ×1 IMPLANT
GOWN STRL REUS W/ TWL XL LVL3 (GOWN DISPOSABLE) ×2 IMPLANT
GOWN STRL REUS W/TWL XL LVL3 (GOWN DISPOSABLE) ×2
HEMOSTAT SURGICEL 2X4 FIBR (HEMOSTASIS) ×1 IMPLANT
ILLUMINATOR WAVEGUIDE N/F (MISCELLANEOUS) ×1 IMPLANT
KIT BASIN OR (CUSTOM PROCEDURE TRAY) ×1 IMPLANT
KIT TURNOVER KIT A (KITS) IMPLANT
PACK BASIC VI WITH GOWN DISP (CUSTOM PROCEDURE TRAY) ×1 IMPLANT
SHEARS HARMONIC 9CM CVD (BLADE) ×1 IMPLANT
SUT MNCRL AB 4-0 PS2 18 (SUTURE) ×1 IMPLANT
SUT VIC AB 3-0 SH 18 (SUTURE) ×2 IMPLANT
SUT VIC AB 3-0 SH 27 (SUTURE) ×1
SUT VIC AB 3-0 SH 27X BRD (SUTURE) IMPLANT
SYR BULB IRRIG 60ML STRL (SYRINGE) ×1 IMPLANT
TOWEL OR 17X26 10 PK STRL BLUE (TOWEL DISPOSABLE) ×1 IMPLANT
TOWEL OR NON WOVEN STRL DISP B (DISPOSABLE) ×1 IMPLANT
TUBING CONNECTING 10 (TUBING) ×1 IMPLANT

## 2022-09-18 NOTE — Discharge Instructions (Signed)
[facility] - Dr. Dolphus Linch  THYROID & PARATHYROID SURGERY:  POST-OP INSTRUCTIONS  Always review the instruction sheet provided by the hospital nurse at discharge.  A prescription for pain medication may be sent to your pharmacy at the time of discharge.  Take your pain medication as prescribed.  If narcotic pain medicine is not needed, then you may take acetaminophen (Tylenol) or ibuprofen (Advil) as needed for pain or soreness.  Take your normal home medications as prescribed unless otherwise directed.  If you need a refill on your pain medication, please contact the office during regular business hours.  Prescriptions will not be processed by the office after 5:00PM or on weekends.  Start with a light diet upon arrival home, such as soup and crackers or toast.  Be sure to drink plenty of fluids.  Resume your normal diet the day after surgery.  Most patients will experience some swelling and bruising on the chest and neck area.  Ice packs will help for the first 48 hours after arriving home.  Swelling and bruising will take several days to resolve.   It is common to experience some constipation after surgery.  Increasing fluid intake and taking a stool softener (Colace) will usually help to prevent this problem.  A mild laxative (Milk of Magnesia or Miralax) should be taken according to package directions if there has been no bowel movement after 48 hours.  Dermabond glue covers your incision. This seals the wound and you may shower at any time. The Dermabond will remain in place for about a week.  You may gradually remove the glue when it loosens around the edges.  If you need to loosen the Dermabond for removal, apply a layer of Vaseline to the wound for 15 minutes and then remove with a Kleenex. Your sutures are under the skin and will not show - they will dissolve on their own.  You may resume light daily activities beginning the day after discharge (such as self-care,  walking, climbing stairs), gradually increasing activities as tolerated. You may have sexual intercourse when it is comfortable. Refrain from any heavy lifting or straining until approved by your doctor. You may drive when you no longer are taking prescription pain medication, you can comfortably wear a seatbelt, and you can safely maneuver your car and apply the brakes.  You will see your doctor in the office for a follow-up appointment approximately three weeks after your surgery.  Make sure that you call for this appointment within a day or two after you arrive home to insure a convenient appointment time. Please have any requested laboratory tests performed a few days prior to your office visit so that the results will be available at your follow up appointment.  WHEN TO CALL THE [facility] OFFICE: -- Fever greater than 101.5 -- Inability to urinate -- Nausea and/or vomiting - persistent -- Extreme swelling or bruising -- Continued bleeding from incision -- Increased pain, redness, or drainage from the incision -- Difficulty swallowing or breathing -- Muscle cramping or spasms -- Numbness or tingling in hands or around lips  The clinic staff is available to answer your questions during regular business hours.  Please don't hesitate to call and ask to speak to one of the nurses if you have concerns.  [facility] OFFICE: [phone] (24 hours)  Please sign up for MyChart accounts. This will allow you to communicate directly with my nurse or myself without having to call the office. It will also allow you   to view your test results. You will need to enroll in MyChart for my office ([facility]) and for the hospital ([facility]).  Vayda Dungee, MD [facility] practice 

## 2022-09-18 NOTE — Anesthesia Procedure Notes (Signed)
Procedure Name: Intubation Date/Time: 09/18/2022 2:10 PM  Performed by: Jenne Campus, CRNAPre-anesthesia Checklist: Patient identified, Emergency Drugs available, Suction available and Patient being monitored Patient Re-evaluated:Patient Re-evaluated prior to induction Oxygen Delivery Method: Circle System Utilized Preoxygenation: Pre-oxygenation with 100% oxygen Induction Type: IV induction Ventilation: Mask ventilation without difficulty Laryngoscope Size: Miller and 3 Grade View: Grade I Tube type: Oral Tube size: 7.0 mm Number of attempts: 1 Airway Equipment and Method: Stylet and Oral airway Placement Confirmation: ETT inserted through vocal cords under direct vision, positive ETCO2 and breath sounds checked- equal and bilateral Secured at: 21 cm Tube secured with: Tape Dental Injury: Teeth and Oropharynx as per pre-operative assessment

## 2022-09-18 NOTE — Anesthesia Postprocedure Evaluation (Signed)
Anesthesia Post Note  Patient: Barbara Estrada  Procedure(s) Performed: TOTAL THYROIDECTOMY (Neck)     Patient location during evaluation: PACU Anesthesia Type: General Level of consciousness: awake and alert Pain management: pain level controlled Vital Signs Assessment: post-procedure vital signs reviewed and stable Respiratory status: spontaneous breathing, nonlabored ventilation, respiratory function stable and patient connected to nasal cannula oxygen Cardiovascular status: blood pressure returned to baseline and stable Postop Assessment: no apparent nausea or vomiting Anesthetic complications: no   No notable events documented.  Last Vitals:  Vitals:   09/18/22 1630 09/18/22 1645  BP: (!) 172/98 (!) 158/82  Pulse: 69 72  Resp: 15 13  Temp:    SpO2: 100% 100%    Last Pain:  Vitals:   09/18/22 1630  TempSrc:   PainSc: 0-No pain                 Belenda Cruise P Tamila Gaulin

## 2022-09-18 NOTE — Interval H&P Note (Signed)
History and Physical Interval Note:  09/18/2022 1:25 PM  Barbara Estrada  has presented today for surgery, with the diagnosis of THYROID NEOPLASM OF UNCERTAIN BEHAVIOR.  The various methods of treatment have been discussed with the patient and family. After consideration of risks, benefits and other options for treatment, the patient has consented to    Procedure(s): TOTAL THYROIDECTOMY (N/A) as a surgical intervention.    The patient's history has been reviewed, patient examined, no change in status, stable for surgery.  I have reviewed the patient's chart and labs.  Questions were answered to the patient's satisfaction.    Armandina Gemma, [facility] A [place] practice Office: [place]

## 2022-09-18 NOTE — Anesthesia Preprocedure Evaluation (Addendum)
Anesthesia Evaluation  Patient identified by MRN, date of birth, ID band Patient awake    Reviewed: Allergy & Precautions, NPO status , Patient's Chart, lab work & pertinent test results  Airway Mallampati: II  TM Distance: >3 FB Neck ROM: Full    Dental no notable dental hx. (+) Teeth Intact, Dental Advisory Given   Pulmonary neg pulmonary ROS   Pulmonary exam normal        Cardiovascular negative cardio ROS  Rhythm:Regular Rate:Normal     Neuro/Psych negative neurological ROS  negative psych ROS   GI/Hepatic negative GI ROS, Neg liver ROS,,,  Endo/Other  Thyroid mass  Renal/GU negative Renal ROS  negative genitourinary   Musculoskeletal  (+) Arthritis , Osteoarthritis,    Abdominal Normal abdominal exam  (+)   Peds  Hematology negative hematology ROS (+)   Anesthesia Other Findings   Reproductive/Obstetrics                              Anesthesia Physical Anesthesia Plan  ASA: 2  Anesthesia Plan: General   Post-op Pain Management:    Induction: Intravenous  PONV Risk Score and Plan: 3 and Ondansetron, Dexamethasone, Midazolam and Treatment may vary due to age or medical condition  Airway Management Planned: Mask and Oral ETT  Additional Equipment: None  Intra-op Plan:   Post-operative Plan: Extubation in OR  Informed Consent: I have reviewed the patients History and Physical, chart, labs and discussed the procedure including the risks, benefits and alternatives for the proposed anesthesia with the patient or authorized representative who has indicated his/her understanding and acceptance.     Dental advisory given  Plan Discussed with: CRNA  Anesthesia Plan Comments:         Anesthesia Quick Evaluation

## 2022-09-18 NOTE — Transfer of Care (Signed)
Immediate Anesthesia Transfer of Care Note  Patient: Barbara Estrada  Procedure(s) Performed: TOTAL THYROIDECTOMY (Neck)  Patient Location: PACU  Anesthesia Type:General  Level of Consciousness: awake, oriented, and patient cooperative  Airway & Oxygen Therapy: Patient Spontanous Breathing and Patient connected to nasal cannula oxygen  Post-op Assessment: Report given to RN and Post -op Vital signs reviewed and stable  Post vital signs: Reviewed  Last Vitals:  Vitals Value Taken Time  BP 172/107 09/18/22 1606  Temp    Pulse 75 09/18/22 1608  Resp 15 09/18/22 1608  SpO2 100 % 09/18/22 1608  Vitals shown include unvalidated device data.  Last Pain:  Vitals:   09/18/22 1152  TempSrc: Oral  PainSc:          Complications: No notable events documented.

## 2022-09-18 NOTE — Op Note (Signed)
Procedure Note  Pre-operative Diagnosis:  thyroid neoplasm of uncertain behavior, multiple thyroid nodules  Post-operative Diagnosis:  same  Surgeon:  Armandina Gemma, MD  Assistant:  none   Procedure:  Total thyroidectomy  Anesthesia:  General  Estimated Blood Loss:  minimal  Drains: none         Specimen: thyroid to pathology  Indications:  Patient is referred by Dr. Jacelyn Pi for surgical evaluation and management of a thyroid neoplasm of uncertain behavior, Bethesda category V, in the setting of multiple thyroid nodules. Patient had been originally diagnosed while seeing Dr. Chalmers Cater management of a pituitary adenoma. She was noted on physical examination to have thyroid nodules. She has undergone sequential ultrasound examinations. Her most recent study was in July 2023. A left inferior nodule measuring 1.7 cm was felt to be suspicious and fine-needle aspiration biopsy was recommended. This was performed on June 14, 2022. Cytopathology came back as suspicious for malignancy. Based on her Bethesda category, this has a risk of cancer of approximately 90%. Patient has normal thyroid function. Her TSH level is 0.951. She has never been on thyroid medication. She has had no prior head or neck surgery.   Patient now comes to surgery for thyroidectomy for definitive diagnosis and management.  Procedure Details: Procedure was done in OR #1 at the [facility]. The patient was brought to the operating room and placed in a supine position on the operating room table. Following administration of general anesthesia, the patient was positioned and then prepped and draped in the usual aseptic fashion. After ascertaining that an adequate level of anesthesia had been achieved, a small Kocher incision was made with #15 blade. Dissection was carried through subcutaneous tissues and platysma.Hemostasis was achieved with the electrocautery. Skin flaps were elevated cephalad and caudad from the thyroid  notch to the sternal notch. A Mahorner self-retaining retractor was placed for exposure. Strap muscles were incised in the midline and dissection was begun on the left side.  Strap muscles were reflected laterally.  Left thyroid lobe was normal in size with palpable nodules.  The left lobe was gently mobilized with blunt dissection. Superior pole vessels were dissected out and divided individually between small and medium ligaclips with the harmonic scalpel. The thyroid lobe was rolled anteriorly. Branches of the inferior thyroid artery were divided between small ligaclips with the harmonic scalpel. Inferior venous tributaries were divided between ligaclips. Both the superior and inferior parathyroid glands were identified and preserved on their vascular pedicles. The recurrent laryngeal nerve was identified and preserved along its course. The ligament of Gwenlyn Found was released with the electrocautery and the gland was mobilized onto the anterior trachea. Isthmus was mobilized across the midline. There was a moderate sized pyramidal lobe present which was resected en bloc with the isthmus. Dry pack was placed in the left neck.  The right thyroid lobe was gently mobilized with blunt dissection. Right thyroid lobe was mildly enlarged with multiple nodules. Superior pole vessels were dissected out and divided between small and medium ligaclips with the Harmonic scalpel. Superior parathyroid was identified and preserved. Inferior venous tributaries were divided between medium ligaclips with the harmonic scalpel. The right thyroid lobe was rolled anteriorly and the branches of the inferior thyroid artery divided between small ligaclips. The right recurrent laryngeal nerve was identified and preserved along its course. The ligament of Gwenlyn Found was released with the electrocautery. The right thyroid lobe was mobilized onto the anterior trachea and the remainder of the thyroid was dissected  off the anterior trachea and the  thyroid was completely excised. A suture was used to mark the left lobe. The entire thyroid gland was submitted to pathology for review.  The neck was irrigated with warm saline. Fibrillar was placed throughout the operative field. Strap muscles were approximated in the midline with interrupted 3-0 Vicryl sutures. Platysma was closed with interrupted 3-0 Vicryl sutures. Skin was closed with a running 4-0 Monocryl subcuticular suture. Wound was washed and Dermabond was applied. The patient was awakened from anesthesia and brought to the recovery room. The patient tolerated the procedure well.   Armandina Gemma, [facility] Office: [phone]

## 2022-09-19 ENCOUNTER — Encounter (HOSPITAL_COMMUNITY): Payer: Self-pay | Admitting: Surgery

## 2022-09-19 DIAGNOSIS — E042 Nontoxic multinodular goiter: Secondary | ICD-10-CM | POA: Diagnosis not present

## 2022-09-19 LAB — BASIC METABOLIC PANEL
Anion gap: 9 (ref 5–15)
BUN: 17 mg/dL (ref 6–20)
CO2: 25 mmol/L (ref 22–32)
Calcium: 8.8 mg/dL — ABNORMAL LOW (ref 8.9–10.3)
Chloride: 102 mmol/L (ref 98–111)
Creatinine, Ser: 0.92 mg/dL (ref 0.44–1.00)
GFR, Estimated: >60 mL/min (ref >60)
Glucose, Bld: 110 mg/dL — ABNORMAL HIGH (ref 70–99)
Potassium: 4.1 mmol/L (ref 3.5–5.1)
Sodium: 136 mmol/L (ref 135–145)

## 2022-09-19 MED ORDER — CALCIUM CARBONATE ANTACID 500 MG PO CHEW: 2.0000 | CHEWABLE_TABLET | Freq: Three times a day (TID) | ORAL | 1 refills | Status: AC

## 2022-09-19 MED ORDER — LEVOTHYROXINE SODIUM 100 MCG PO TABS: 100.0000 ug | ORAL_TABLET | Freq: Every day | ORAL | 3 refills | Status: AC

## 2022-09-19 NOTE — Progress Notes (Signed)
  Transition of Care [facility]) Screening Note   Patient Details  Name: Barbara Estrada Date of Birth: [birth year]   Transition of Care [facility]) CM/SW Contact:    Lennart Pall, LCSW Phone Number: 09/19/2022, 10:40 AM    Transition of Care Department [facility]) has reviewed patient and no TOC needs have been identified at this time. We will continue to monitor patient advancement through interdisciplinary progression rounds. If new patient transition needs arise, please place a TOC consult.

## 2022-09-19 NOTE — Progress Notes (Signed)
Discharge instructions discussed with patient and family, verbalized agreement and understanding 

## 2022-09-19 NOTE — Discharge Summary (Signed)
    Physician Discharge Summary   Patient ID: Barbara Estrada MRN: [id number] DOB/AGE: July 01, 1966 56 y.o.  Admit date: 09/18/2022  Discharge date: 09/19/2022  Discharge Diagnoses:  Principal Problem:   Neoplasm of uncertain behavior of thyroid gland Active Problems:   Multiple thyroid nodules   Discharged Condition: good  Hospital Course: Patient was admitted for observation following thyroid surgery.  Post op course was uncomplicated.  Pain was well controlled.  Tolerated diet.  Post op calcium level on morning following surgery was 8.8 mg/dl.  Patient was prepared for discharge home on POD#1.  Consults: None  Treatments: surgery: total thyroidectomy  Discharge Exam: Blood pressure 127/72, pulse 72, temperature 98.9 F (37.2 C), temperature source Oral, resp. rate 18, height '5\' 7"'$  (1.702 m), weight 81.6 kg, last menstrual period 04/07/2015, SpO2 95 %. HEENT - clear Neck - wound dry and intact; mild soft tissue swelling, mild ecchymosis; Dermabond in place  Disposition: Home  Discharge Instructions     Diet - low sodium heart healthy   Complete by: As directed    Increase activity slowly   Complete by: As directed    No dressing needed   Complete by: As directed       Allergies as of 09/19/2022   Not on File      Medication List     TAKE these medications    calcium carbonate 500 MG chewable tablet Commonly known as: Tums Chew 2 tablets (400 mg of elemental calcium total) by mouth 3 (three) times daily.   COLLAGEN PO Take 6,000 mg by mouth daily. 1000 mg   FISH OIL PO Take 1 Package by mouth daily. 2000 mg   levothyroxine 100 MCG tablet Commonly known as: Synthroid Take 1 tablet (100 mcg total) by mouth daily before breakfast.   magnesium oxide 400 (240 Mg) MG tablet Commonly known as: MAG-OX Take 400 mg by mouth daily.   multivitamin with minerals tablet Take 1 tablet by mouth daily.   PROBIOTIC PO Take 1 tablet by mouth daily.                Discharge Care Instructions  (From admission, onward)           Start     Ordered   09/19/22 0000  No dressing needed        09/19/22 1357            Follow-up Information     Armandina Gemma, MD. Schedule an appointment as soon as possible for a visit in 3 week(s).   Specialty: General Surgery Why: For wound re-check Contact information: [place] [address] [phone]                 Rocko Fesperman, [facility] Office: [phone]   Signed: Armandina Gemma 09/19/2022, 1:58 PM

## 2022-09-22 LAB — SURGICAL PATHOLOGY

## 2022-09-25 NOTE — Progress Notes (Signed)
Great news!  Final pathology results are benign - no cancer!  Hope you are doing well.  Will have printed report for you when you come to the office.  [place], MD [facility] A [place] practice Office: [phone]

## 2022-10-19 ENCOUNTER — Ambulatory Visit
Admission: RE | Admit: 2022-10-19 | Discharge: 2022-10-19 | Disposition: A | Discharge status: Home / Self Care | Payer: 59 | Source: Ambulatory Visit | Attending: Endocrinology | Admitting: Endocrinology

## 2022-10-19 DIAGNOSIS — M8580 Other specified disorders of bone density and structure, unspecified site: Secondary | ICD-10-CM

## 2024-05-07 ENCOUNTER — Other Ambulatory Visit: Payer: Self-pay | Admitting: Endocrinology

## 2024-05-07 DIAGNOSIS — D443 Neoplasm of uncertain behavior of pituitary gland: Secondary | ICD-10-CM

## 2024-05-07 DIAGNOSIS — E221 Hyperprolactinemia: Secondary | ICD-10-CM

## 2024-05-16 ENCOUNTER — Ambulatory Visit
Admission: RE | Admit: 2024-05-16 | Discharge: 2024-05-16 | Disposition: A | Discharge status: Home / Self Care | Source: Ambulatory Visit | Attending: Endocrinology | Admitting: Endocrinology

## 2024-05-16 DIAGNOSIS — E221 Hyperprolactinemia: Secondary | ICD-10-CM

## 2024-05-16 DIAGNOSIS — D443 Neoplasm of uncertain behavior of pituitary gland: Secondary | ICD-10-CM

## 2024-05-16 MED ORDER — GADOPICLENOL 0.5 MMOL/ML IV SOLN
8.0000 mL | Freq: Once | INTRAVENOUS | Status: AC | PRN
  Administered 2024-05-16: 8 mL via INTRAVENOUS
